# Patient Record
Sex: Male | Born: 1955 | Race: Black or African American | Hispanic: No | Marital: Single | Smoking: Former smoker
Health system: Southern US, Community
[De-identification: ages and names within clinical notes are randomized; demographics above are authoritative.]

## PROBLEM LIST (undated history)

## (undated) DIAGNOSIS — E7800 Pure hypercholesterolemia, unspecified: Secondary | ICD-10-CM

## (undated) DIAGNOSIS — R269 Unspecified abnormalities of gait and mobility: Secondary | ICD-10-CM

## (undated) DIAGNOSIS — I10 Essential (primary) hypertension: Secondary | ICD-10-CM

---

## 2020-09-04 ENCOUNTER — Other Ambulatory Visit: Payer: Self-pay

## 2020-09-04 ENCOUNTER — Emergency Department (HOSPITAL_COMMUNITY)
Admission: EM | Admit: 2020-09-04 | Discharge: 2020-09-05 | Disposition: A | Discharge status: Skilled Nursing Facility | Payer: Medicaid Other | Attending: Emergency Medicine | Admitting: Emergency Medicine

## 2020-09-04 DIAGNOSIS — R7981 Abnormal blood-gas level: Secondary | ICD-10-CM | POA: Insufficient documentation

## 2020-09-04 DIAGNOSIS — Z87891 Personal history of nicotine dependence: Secondary | ICD-10-CM | POA: Diagnosis not present

## 2020-09-04 DIAGNOSIS — I10 Essential (primary) hypertension: Secondary | ICD-10-CM | POA: Insufficient documentation

## 2020-09-04 DIAGNOSIS — R0602 Shortness of breath: Secondary | ICD-10-CM | POA: Diagnosis present

## 2020-09-04 HISTORY — DX: Essential (primary) hypertension: I10

## 2020-09-04 HISTORY — DX: Pure hypercholesterolemia, unspecified: E78.00

## 2020-09-04 HISTORY — DX: Unspecified abnormalities of gait and mobility: R26.9

## 2020-09-04 NOTE — ED Triage Notes (Signed)
Pt arrives by EMS from [facility].  Pt had low spo2 for nursing home staff around 60% on RA and they placed him on 4L [place], they reported to ems that pt was not in any distress with this and they and called ems an hour later.  EMS found pt in no distress spo2 in high 90's on RA.  Pt denies any sob or distress and is speaking in full sentences.  Pt has cool fingers on arrival to ED and a little tremor making it difficult for the spo2 to read on his finger, once spo2 monitor was placed on his earlobe and a good waveform is seen and pt spo2 100% on RA

## 2020-09-05 ENCOUNTER — Encounter (HOSPITAL_COMMUNITY): Payer: Self-pay | Admitting: *Deleted

## 2020-09-05 ENCOUNTER — Other Ambulatory Visit: Payer: Self-pay

## 2020-09-05 NOTE — ED Notes (Signed)
pt appears in no distress and reports no distress today PTA.  spo2 reading on fingers was not possible with our probe.  infant probe placed on earlobe showing 100% on RA.  EMS reports that when they arrived at SNF they could not get their monitor to read but used another probe en route which showed high 90's to 100% on RA.

## 2020-09-05 NOTE — Discharge Instructions (Addendum)
The patient's oxygen saturation have remained >95% on room air. His fingers are cold and will limit his reading. His ear lobes provided a good reading.

## 2020-09-05 NOTE — ED Notes (Signed)
Call for transport back to SNF

## 2020-09-05 NOTE — ED Provider Notes (Signed)
Dean Phillips Provider Note  CSN: [id number] Arrival date & time: 09/04/20 2349  Chief Complaint(s) Shortness of Breath  HPI Dean Phillips is a 64 y.o. male with a past medical history listed below including prior CVA living in a skilled nursing facility who presents to the emergency department with low oxygen saturation level readings. Patient reports that he was getting routine vitals obtained when they noted low saturations. He denied feeling any shortness of breath. He denies recent cough or congestion. No associated chest pain. The staff at the facility placed the patient on 4 L nasal cannula for about an hour. Afterwards they called EMS. When EMS reportedly arrived patient was satting in the high 90s on room air and without any respiratory complaints. He remained hemodynamically stable in route. Currently he has no physical complaints other than being hungry.  HPI  Past Medical History Past Medical History:  Diagnosis Date  . Gait abnormality   . High cholesterol   . Hypertension    There are no problems to display for this patient.  Home Medication(s) Prior to Admission medications   Not on File                                                                                                                                    Past Surgical History History reviewed. No pertinent surgical history. Family History No family history on file.  Social History Social History   Tobacco Use  . Smoking status: Former Research scientist (life sciences)  . Smokeless tobacco: Never Used  Substance Use Topics  . Alcohol use: Not Currently  . Drug use: Not Currently   Allergies Patient has no known allergies.  Review of Systems Review of Systems All other systems are reviewed and are negative for acute change except as noted in the HPI  Physical Exam Vital Signs  I have reviewed the triage vital signs BP 159/99   Pulse 98  Temp 98.4 F (36.9 C) (Oral)   Resp (!) 21    Wt 71.7 kg   SpO2 99%   Physical Exam Vitals reviewed.  Constitutional:      General: He is not in acute distress.    Appearance: He is well-developed. He is not diaphoretic.  HENT:     Head: Normocephalic and atraumatic.     Nose: Nose normal.  Eyes:     General: No scleral icterus.       Right eye: No discharge.        Left eye: No discharge.     Conjunctiva/sclera: Conjunctivae normal.     Pupils: Pupils are equal, round, and reactive to light.  Cardiovascular:     Rate and Rhythm: Normal rate and regular rhythm.     Heart sounds: No murmur heard.  No friction rub. No gallop.   Pulmonary:     Effort: Pulmonary effort is normal. No respiratory distress.     Breath  sounds: Normal breath sounds. No stridor. No rales.  Abdominal:     General: There is no distension.     Palpations: Abdomen is soft.     Tenderness: There is no abdominal tenderness.  Musculoskeletal:        General: No tenderness.     Cervical back: Normal range of motion and neck supple.  Skin:    General: Skin is warm and dry.     Findings: No erythema or rash.  Neurological:     Mental Status: He is alert and oriented to person, place, and time.     ED Results and Treatments Labs (all labs ordered are listed, but only abnormal results are displayed) Labs Reviewed - No data to display                                                                                                                       EKG  EKG Interpretation  Date/Time:    Ventricular Rate:    PR Interval:    QRS Duration:   QT Interval:    QTC Calculation:   R Axis:     Text Interpretation:        Radiology No results found.  Pertinent labs & imaging results that were available during my care of the patient were reviewed by me and considered in my medical decision making (see chart for details).  Medications Ordered in ED Medications - No data to display                                                                                                                                   Procedures Procedures  (including critical care time)  Medical Decision Making / ED Course I have reviewed the nursing notes for this encounter and the patient's prior records (if available in EHR or on provided paperwork).   Dean Phillips was evaluated in Emergency Department on 09/05/2020 for the symptoms described in the history of present illness. He was evaluated in the context of the global COVID-19 pandemic, which necessitated consideration that the patient might be at risk for infection with the SARS-CoV-2 virus that causes COVID-19. Institutional protocols and algorithms that pertain to the evaluation of patients at risk for COVID-19 are in a state of rapid change based on information released by regulatory bodies including the CDC and federal and state organizations. These policies and algorithms were followed during the patient's  care in the ED.  Patient is in no respiratory distress satting greater than 95% on room air. Lungs are clear to auscultation bilaterally. Discussed obtaining labs and imaging with the patient which he graciously declined. He did request being provided with food which we gladly provided. Patient was monitored for several hours without any deterioration. Felt to be safe to be transferred back to the facility.      Final Clinical Impression(s) / ED Diagnoses Final diagnoses:  Low O2 saturation   The patient appears reasonably screened and/or stabilized for discharge and I doubt any other medical condition or other [facility] requiring further screening, evaluation, or treatment in the ED at this time prior to discharge. Safe for discharge with strict return precautions.  Disposition: Discharge  Condition: Good  I have discussed the results, Dx and Tx plan with the patient/family who expressed understanding and agree(s) with the plan. Discharge instructions discussed at length. The patient/family was  given strict return precautions who verbalized understanding of the instructions. No further questions at time of discharge.    ED Discharge Orders    None        Follow Up: Wenda Low, MD [address] [place] [address] [phone]  Schedule an appointment as soon as possible for a visit  As needed      This chart was dictated using voice recognition software.  Despite best efforts to proofread,  errors can occur which can change the documentation meaning.   Fatima Blank, MD 09/05/20 0300

## 2020-12-21 ENCOUNTER — Encounter (HOSPITAL_COMMUNITY): Payer: Self-pay | Admitting: Internal Medicine

## 2020-12-21 ENCOUNTER — Emergency Department (HOSPITAL_COMMUNITY): Payer: Medicaid Other

## 2020-12-21 ENCOUNTER — Inpatient Hospital Stay (HOSPITAL_COMMUNITY)
Admission: EM | Admit: 2020-12-21 | Discharge: 2020-12-26 | DRG: 177 | Disposition: A | Discharge status: Hospice | Payer: Medicaid Other | Attending: Internal Medicine | Admitting: Internal Medicine

## 2020-12-21 ENCOUNTER — Inpatient Hospital Stay (HOSPITAL_COMMUNITY): Payer: Medicaid Other

## 2020-12-21 DIAGNOSIS — Z21 Asymptomatic human immunodeficiency virus [HIV] infection status: Secondary | ICD-10-CM | POA: Diagnosis present

## 2020-12-21 DIAGNOSIS — I120 Hypertensive chronic kidney disease with stage 5 chronic kidney disease or end stage renal disease: Secondary | ICD-10-CM | POA: Diagnosis present

## 2020-12-21 DIAGNOSIS — E872 Acidosis: Secondary | ICD-10-CM | POA: Diagnosis present

## 2020-12-21 DIAGNOSIS — E8770 Fluid overload, unspecified: Secondary | ICD-10-CM | POA: Diagnosis present

## 2020-12-21 DIAGNOSIS — Z8673 Personal history of transient ischemic attack (TIA), and cerebral infarction without residual deficits: Secondary | ICD-10-CM

## 2020-12-21 DIAGNOSIS — Z515 Encounter for palliative care: Secondary | ICD-10-CM

## 2020-12-21 DIAGNOSIS — G9340 Encephalopathy, unspecified: Secondary | ICD-10-CM

## 2020-12-21 DIAGNOSIS — E878 Other disorders of electrolyte and fluid balance, not elsewhere classified: Secondary | ICD-10-CM | POA: Diagnosis present

## 2020-12-21 DIAGNOSIS — Z820 Family history of epilepsy and other diseases of the nervous system: Secondary | ICD-10-CM

## 2020-12-21 DIAGNOSIS — E785 Hyperlipidemia, unspecified: Secondary | ICD-10-CM | POA: Diagnosis present

## 2020-12-21 DIAGNOSIS — J1282 Pneumonia due to coronavirus disease 2019: Secondary | ICD-10-CM | POA: Diagnosis present

## 2020-12-21 DIAGNOSIS — N179 Acute kidney failure, unspecified: Secondary | ICD-10-CM

## 2020-12-21 DIAGNOSIS — E875 Hyperkalemia: Secondary | ICD-10-CM | POA: Diagnosis present

## 2020-12-21 DIAGNOSIS — E860 Dehydration: Secondary | ICD-10-CM | POA: Diagnosis present

## 2020-12-21 DIAGNOSIS — Z8249 Family history of ischemic heart disease and other diseases of the circulatory system: Secondary | ICD-10-CM

## 2020-12-21 DIAGNOSIS — Z66 Do not resuscitate: Secondary | ICD-10-CM | POA: Diagnosis not present

## 2020-12-21 DIAGNOSIS — E870 Hyperosmolality and hypernatremia: Secondary | ICD-10-CM | POA: Diagnosis present

## 2020-12-21 DIAGNOSIS — G9341 Metabolic encephalopathy: Secondary | ICD-10-CM | POA: Diagnosis present

## 2020-12-21 DIAGNOSIS — U071 COVID-19: Principal | ICD-10-CM | POA: Diagnosis present

## 2020-12-21 DIAGNOSIS — J9601 Acute respiratory failure with hypoxia: Secondary | ICD-10-CM | POA: Diagnosis present

## 2020-12-21 DIAGNOSIS — N186 End stage renal disease: Secondary | ICD-10-CM | POA: Diagnosis present

## 2020-12-21 DIAGNOSIS — F0390 Unspecified dementia without behavioral disturbance: Secondary | ICD-10-CM | POA: Diagnosis present

## 2020-12-21 DIAGNOSIS — N170 Acute kidney failure with tubular necrosis: Secondary | ICD-10-CM

## 2020-12-21 DIAGNOSIS — R0902 Hypoxemia: Secondary | ICD-10-CM | POA: Diagnosis not present

## 2020-12-21 DIAGNOSIS — Z87891 Personal history of nicotine dependence: Secondary | ICD-10-CM | POA: Diagnosis not present

## 2020-12-21 DIAGNOSIS — E8729 Other acidosis: Secondary | ICD-10-CM

## 2020-12-21 LAB — CBC WITH DIFFERENTIAL/PLATELET
Abs Immature Granulocytes: 0.07 10*3/uL (ref 0.00–0.07)
Basophils Absolute: 0 10*3/uL (ref 0.0–0.1)
Basophils Relative: 0 %
Eosinophils Absolute: 0 10*3/uL (ref 0.0–0.5)
Eosinophils Relative: 0 %
HCT: 45.6 % (ref 39.0–52.0)
Hemoglobin: 14.7 g/dL (ref 13.0–17.0)
Immature Granulocytes: 1 %
Lymphocytes Relative: 8 %
Lymphs Abs: 0.9 10*3/uL (ref 0.7–4.0)
MCH: 30 pg (ref 26.0–34.0)
MCHC: 32.2 g/dL (ref 30.0–36.0)
MCV: 93.1 fL (ref 80.0–100.0)
Monocytes Absolute: 0.7 10*3/uL (ref 0.1–1.0)
Monocytes Relative: 7 %
Neutro Abs: 8.6 10*3/uL — ABNORMAL HIGH (ref 1.7–7.7)
Neutrophils Relative %: 84 %
Platelets: 179 10*3/uL (ref 150–400)
RBC: 4.9 MIL/uL (ref 4.22–5.81)
RDW: 16.1 % — ABNORMAL HIGH (ref 11.5–15.5)
WBC: 10.2 10*3/uL (ref 4.0–10.5)
nRBC: 0 % (ref 0.0–0.2)

## 2020-12-21 LAB — COMPREHENSIVE METABOLIC PANEL
ALT: 31 U/L (ref 0–44)
AST: 44 U/L — ABNORMAL HIGH (ref 15–41)
Albumin: 2.7 g/dL — ABNORMAL LOW (ref 3.5–5.0)
Alkaline Phosphatase: 66 U/L (ref 38–126)
Anion gap: 17 — ABNORMAL HIGH (ref 5–15)
BUN: 133 mg/dL — ABNORMAL HIGH (ref 8–23)
CO2: 19 mmol/L — ABNORMAL LOW (ref 22–32)
Calcium: 8.4 mg/dL — ABNORMAL LOW (ref 8.9–10.3)
Chloride: 116 mmol/L — ABNORMAL HIGH (ref 98–111)
Creatinine, Ser: 9.59 mg/dL — ABNORMAL HIGH (ref 0.61–1.24)
GFR, Estimated: 6 mL/min — ABNORMAL LOW (ref >60)
Glucose, Bld: 132 mg/dL — ABNORMAL HIGH (ref 70–99)
Potassium: 5.6 mmol/L — ABNORMAL HIGH (ref 3.5–5.1)
Sodium: 152 mmol/L — ABNORMAL HIGH (ref 135–145)
Total Bilirubin: 0.9 mg/dL (ref 0.3–1.2)
Total Protein: 7.9 g/dL (ref 6.5–8.1)

## 2020-12-21 LAB — AMMONIA: Ammonia: 14 umol/L (ref 9–35)

## 2020-12-21 LAB — LACTIC ACID, PLASMA
Lactic Acid, Venous: 1.9 mmol/L (ref 0.5–1.9)
Lactic Acid, Venous: 1.5 mmol/L (ref 0.5–1.9)

## 2020-12-21 LAB — CBG MONITORING, ED: Glucose-Capillary: 109 mg/dL — ABNORMAL HIGH (ref 70–99)

## 2020-12-21 LAB — PROTIME-INR
INR: 1.1 (ref 0.8–1.2)
Prothrombin Time: 13.3 seconds (ref 11.4–15.2)

## 2020-12-21 LAB — APTT: aPTT: 40 seconds — ABNORMAL HIGH (ref 24–36)

## 2020-12-21 LAB — SARS CORONAVIRUS 2 BY RT PCR (HOSPITAL ORDER, PERFORMED IN ~~LOC~~ HOSPITAL LAB): SARS Coronavirus 2: POSITIVE — AB

## 2020-12-21 MED ORDER — ASCORBIC ACID 500 MG PO TABS: 500.0000 mg | ORAL_TABLET | Freq: Every day | ORAL | Status: DC

## 2020-12-21 MED ORDER — SODIUM CHLORIDE 0.9 % IV BOLUS
1000.0000 mL | Freq: Once | INTRAVENOUS | Status: AC
  Administered 2020-12-21: 1000 mL via INTRAVENOUS

## 2020-12-21 MED ORDER — DEXAMETHASONE SODIUM PHOSPHATE 10 MG/ML IJ SOLN: 6.0000 mg | INTRAMUSCULAR | Status: DC

## 2020-12-21 MED ORDER — ACETAMINOPHEN 325 MG PO TABS: 650.0000 mg | ORAL_TABLET | Freq: Four times a day (QID) | ORAL | Status: DC | PRN

## 2020-12-21 MED ORDER — HYDROCOD POLST-CPM POLST ER 10-8 MG/5ML PO SUER: 5.0000 mL | Freq: Two times a day (BID) | ORAL | Status: DC | PRN

## 2020-12-21 MED ORDER — SODIUM CHLORIDE 0.9 % IV SOLN
100.0000 mg | Freq: Every day | INTRAVENOUS | Status: DC
  Administered 2020-12-22: 100 mg via INTRAVENOUS
  Filled 2020-12-21: qty 20

## 2020-12-21 MED ORDER — ACETAMINOPHEN 650 MG RE SUPP
650.0000 mg | Freq: Once | RECTAL | Status: AC
  Administered 2020-12-21: 650 mg via RECTAL
  Filled 2020-12-21: qty 1

## 2020-12-21 MED ORDER — HEPARIN SODIUM (PORCINE) 5000 UNIT/ML IJ SOLN
5000.0000 [IU] | Freq: Three times a day (TID) | INTRAMUSCULAR | Status: DC
  Administered 2020-12-22 (×2): 5000 [IU] via SUBCUTANEOUS
  Filled 2020-12-21 (×2): qty 1

## 2020-12-21 MED ORDER — DEXAMETHASONE SODIUM PHOSPHATE 10 MG/ML IJ SOLN
10.0000 mg | Freq: Once | INTRAMUSCULAR | Status: AC
  Administered 2020-12-21: 10 mg via INTRAVENOUS
  Filled 2020-12-21: qty 1

## 2020-12-21 MED ORDER — GUAIFENESIN-DM 100-10 MG/5ML PO SYRP: 10.0000 mL | ORAL_SOLUTION | ORAL | Status: DC | PRN

## 2020-12-21 MED ORDER — SODIUM CHLORIDE 0.9 % IV SOLN
200.0000 mg | Freq: Once | INTRAVENOUS | Status: AC
  Administered 2020-12-21: 200 mg via INTRAVENOUS
  Filled 2020-12-21: qty 40

## 2020-12-21 MED ORDER — SODIUM CHLORIDE 0.9 % IV SOLN: INTRAVENOUS | Status: DC

## 2020-12-21 MED ORDER — IPRATROPIUM-ALBUTEROL 20-100 MCG/ACT IN AERS
1.0000 | INHALATION_SPRAY | Freq: Four times a day (QID) | RESPIRATORY_TRACT | Status: DC | PRN
  Filled 2020-12-21: qty 4

## 2020-12-21 MED ORDER — SODIUM CHLORIDE 0.9% FLUSH
3.0000 mL | Freq: Two times a day (BID) | INTRAVENOUS | Status: DC
  Administered 2020-12-22 – 2020-12-26 (×9): 3 mL via INTRAVENOUS

## 2020-12-21 MED ORDER — ZINC SULFATE 220 (50 ZN) MG PO CAPS: 220.0000 mg | ORAL_CAPSULE | Freq: Every day | ORAL | Status: DC

## 2020-12-21 NOTE — ED Notes (Signed)
PT to US

## 2020-12-21 NOTE — ED Triage Notes (Signed)
Pt arrived to ED via EMS from nursing facility. Per EMS were called for hypoxia. SNF told EMS pt was found with spo2 in the 60's. Upon EMS arrived pt on 4L of O2 and spo2 at 90%. Pt with noted cough and febrile. Pt was previously dx with covid, unknown date.

## 2020-12-21 NOTE — Consult Note (Addendum)
Nephrology Consult   Requesting provider: Milton Ferguson Service requesting consult: ER Reason for consult: AKI on CKD 3   Assessment/Recommendations: Dean Phillips is a/an 65 y.o. male with a past medical history dementia, htn, hld, ckd who present w/ multifocal pneumonia, acute hypoxic respiratory failure, and severe AKI  Severe AKI: w/ electrolyte disturbance and likely uremia.  Renal ultrasound without obstruction.  Differential is broad at this time but most likely ATN in the setting of Covid infection.  Dehydration is possible but creatinine is not significantly improved today after some hydration.  Because of his baseline dementia his long-term candidacy for dialysis is poor.  However, the patient has not improved very quickly and may require dialysis in the interim.  We will have ongoing discussions with daughter as when we talked last night she was not sure if the patient would want dialysis -Suggest on going IV hydration as tolerated with hypoxia -Switch to D5 water as below -Follow-up urinalysis and monitor urine output -Management of hyperkalemia as below -Continue GOC conversations regarding dialysis -Maintain n.p.o. for now for possible catheter  Multifocal Pneumonia/Acute Hypoxic Respiratory Failure: likely ongoing COVID possible secondary bacterial. Defer mgmt to primary  Hypernatremia: likely 2/2 poor PO and dehydration -Switch fluids to D5 water at 125 cc/h -Repeat BMP at noon  Hyperkalemia: 2/2 AKI.  Worsening today unable to tolerate p.o. due to mental status so can't use lokelma.  Treat with insulin and dextrose.  Considering dialysis as above.  AGMA: small AG at 17 2/2 AKI. CTM  HTN: reported history. Unclear meds. Would hold meds for right now especially an ARB/ace or diuretics  Dementia w/ AMS: likely toxic metabolic and uremic AMS currently. Treating issues as above.   Recommendations conveyed to primary service.    [facility] 12/21/2020 6:48 PM   _____________________________________________________________________________________ CC: AKI, AMS  History of Present Illness: Dean Phillips is a/an 65 y.o. male with a past medical history of CKD, dementia, HTN, HLD who presents with hypoxia.  Patient was altered so I obtained history per chart review and his Sister-in-law Leda Quail) who is listed as emergency contact.  Patient has chronic dementia and lives at a long term care facility.  Leda Quail says he has had several hospitalization over the last few years that have led to worsening debility. She does know about him having chronic kidney disease.  Baseline at his baseline he is conversational and able to answer questions, walk, feed himself but his memory is poor.  He was found to be covid+ 2 weeks ago and was isolated at his facility. Minimal symptoms at that time and completed his isolation. For the last 24-48 hours he was more fatigued and was noted to be hypoxic. He has had ongoing cough and fever. No diarrhea per staff. Patient does sound to be more confused than his baseline. Unclear what his urine output or po intake has been like. At this time I do not know what his home medications include.  Patient was requiring 4L [place] on arrival now on 3L. CXR demonstrated multifocal pneumonia. Lab demonstrated K of 5.6, Na of 152, and Crt of 9.6. BL Crt thought to be around 1.5. Lactic acid normal. No hypotension   Medications:  Current Facility-Administered Medications  Medication Dose Route Frequency Provider Last Rate Last Admin  . dexamethasone (DECADRON) injection 10 mg  10 mg Intravenous Once Horton, Kristie M, DO      . sodium chloride 0.9 % bolus 1,000 mL  1,000 mL  Intravenous Once Milton Ferguson, MD       No current outpatient medications on file.     ALLERGIES Patient has no known allergies.  MEDICAL HISTORY Past Medical History:  Diagnosis Date  . Gait abnormality   . High cholesterol   .  Hypertension      SOCIAL HISTORY Social History   Socioeconomic History  . Marital status: Single    Spouse name: Not on file  . Number of children: Not on file  . Years of education: Not on file  . Highest education level: Not on file  Occupational History  . Not on file  Tobacco Use  . Smoking status: Former Research scientist (life sciences)  . Smokeless tobacco: Never Used  Substance and Sexual Activity  . Alcohol use: Not Currently  . Drug use: Not Currently  . Sexual activity: Not Currently  Other Topics Concern  . Not on file  Social History Narrative  . Not on file   Social Determinants of Health   Financial Resource Strain: Not on file  Food Insecurity: Not on file  Transportation Needs: Not on file  Physical Activity: Not on file  Stress: Not on file  Social Connections: Not on file  Intimate Partner Violence: Not on file     FAMILY HISTORY Unable to obtain due to AMS   Review of Systems: Unable to obtain due to AMS  Physical Exam: Vitals:   12/21/20 1830 12/21/20 1845  BP:  116/77  Pulse:  100  Resp: (!) 24 (!) 24  Temp:  99 F (37.2 C)  SpO2:  100%   Total I/O In: 500 [I.V.:500] Out: -   Intake/Output Summary (Last 24 hours) at 12/21/2020 1848 Last data filed at 12/21/2020 1315 Gross per 24 hour  Intake 500 ml  Output --  Net 500 ml   General: ill appearing, lying in bed HEENT: anicteric sclera, dry mucus membranes CV: tachycardia, no obvius murmurs Lungs: coarse bilateral breath sounds, no iwob Abd: soft, non-tender, non-distended Skin: no visible lesions or rashes MSK; no joint swelling or deformity Neuro: awake, tired, unable to answers questions Psych: mood seems normal, normal affect  Test Results Reviewed Lab Results  Component Value Date   NA 152 (H) 12/21/2020   K 5.6 (H) 12/21/2020   CL 116 (H) 12/21/2020   CO2 19 (L) 12/21/2020   BUN 133 (H) 12/21/2020   CREATININE 9.59 (H) 12/21/2020   CALCIUM 8.4 (L) 12/21/2020   ALBUMIN 2.7 (L)  12/21/2020     I have reviewed all relevant outside healthcare records related to the patient's current hospitalization

## 2020-12-21 NOTE — ED Provider Notes (Signed)
Patient with mild hypoxia and Covid infection.  Patient also with severe dehydration and renal failure.  He will be admitted to medicine with nephrology consult and will be hydrated   Milton Ferguson, MD 12/21/20 [id number]

## 2020-12-21 NOTE — ED Provider Notes (Signed)
[facility] EMERGENCY DEPARTMENT Provider Note   CSN: [id number] Arrival date & time: 12/21/20  1305     History Chief Complaint  Patient presents with  . Respiratory Distress    Dean Phillips is a 65 y.o. male.  HPI   65 year old male with past medical history of HTN, HLD presents the emergency department concern for hypoxia.  Patient was reportedly Covid positive about 2 weeks ago.  Completed his isolation.  Over the last day became weak, was noted to be hypoxic this morning by nursing staff.  He was reportedly hypoxic to the 60s on room air.  Placed on 4 L of nasal cannula and improved into the 90s.  They report cough and fever also.  No GI symptoms.  Patient is weak and alert on arrival but noncontributory towards history at this time.  Past Medical History:  Diagnosis Date  . Gait abnormality   . High cholesterol   . Hypertension     There are no problems to display for this patient.   No past surgical history on file.     No family history on file.  Social History   Tobacco Use  . Smoking status: Former Research scientist (life sciences)  . Smokeless tobacco: Never Used  Substance Use Topics  . Alcohol use: Not Currently  . Drug use: Not Currently    Home Medications Prior to Admission medications   Not on File    Allergies    Patient has no known allergies.  Review of Systems   Review of Systems  Unable to perform ROS: Mental status change    Physical Exam Updated Vital Signs BP 119/83   Pulse (!) 107   Temp (!) 101 F (38.3 C) (Rectal)   Resp (!) 24   SpO2 100%   Physical Exam Vitals and nursing note reviewed.  Constitutional:      Appearance: Normal appearance. He is ill-appearing.  HENT:     Head: Normocephalic.     Mouth/Throat:     Mouth: Mucous membranes are moist.  Eyes:     Pupils: Pupils are equal, round, and reactive to light.  Cardiovascular:     Rate and Rhythm: Normal rate.  Pulmonary:     Effort: Pulmonary effort is normal.  No respiratory distress.     Breath sounds: Rales present.  Abdominal:     Palpations: Abdomen is soft.     Tenderness: There is no abdominal tenderness.  Musculoskeletal:        General: No swelling.  Skin:    General: Skin is warm.  Neurological:     Mental Status: He is alert.     Comments: Mumbling, opens his eyes to name, follows commands  Psychiatric:        Mood and Affect: Mood normal.     ED Results / Procedures / Treatments   Labs (all labs ordered are listed, but only abnormal results are displayed) Labs Reviewed  CBC WITH DIFFERENTIAL/PLATELET - Abnormal; Notable for the following components:      Result Value   RDW 16.1 (*)    Neutro Abs 8.6 (*)    All other components within normal limits  APTT - Abnormal; Notable for the following components:   aPTT 40 (*)    All other components within normal limits  CBG MONITORING, ED - Abnormal; Notable for the following components:   Glucose-Capillary 109 (*)    All other components within normal limits  CULTURE, BLOOD (SINGLE)  URINE CULTURE  SARS  CORONAVIRUS 2 BY RT PCR (HOSPITAL ORDER, [place] [facility])  LACTIC ACID, PLASMA  PROTIME-INR  LACTIC ACID, PLASMA  URINALYSIS, ROUTINE W REFLEX MICROSCOPIC  COMPREHENSIVE METABOLIC PANEL  AMMONIA  I-STAT VENOUS BLOOD GAS, ED    EKG EKG Interpretation  Date/Time:  Thursday December 21 2020 13:14:56 EST Ventricular Rate:  108 PR Interval:    QRS Duration: 84 QT Interval:  331 QTC Calculation: 444 R Axis:   76 Text Interpretation: Sinus tachycardia Consider left ventricular hypertrophy Sinus tachycardia, normal intervals Confirmed by Lavenia Atlas [phone]) on 12/21/2020 1:23:05 PM   Radiology DG Chest Port 1 View  Result Date: 12/21/2020 CLINICAL DATA:  Sepsis EXAM: PORTABLE CHEST 1 VIEW COMPARISON:  None. FINDINGS: There is ill-defined airspace opacity in the right mid lung and both lung base regions. There is atelectatic change in the right  base. No consolidation. Heart size and pulmonary vascularity are within normal limits. No adenopathy. There is aortic atherosclerosis. No bone lesions. IMPRESSION: Areas of ill-defined airspace opacity bilaterally concerning for atypical organism pneumonia. Check of COVID-19 status advised. Right base atelectasis. No consolidation or edema. Heart size normal.  No adenopathy appreciable. Aortic Atherosclerosis (ICD10-I70.0). Electronically Signed   By: Lowella Grip III M.D.   On: 12/21/2020 14:49    Procedures .Critical Care Performed by: Lorelle Gibbs, DO Authorized by: Lorelle Gibbs, DO   Critical care provider statement:    Critical care time (minutes):  45   Critical care was time spent personally by me on the following activities:  Discussions with consultants, evaluation of patient's response to treatment, examination of patient, ordering and performing treatments and interventions, ordering and review of laboratory studies, ordering and review of radiographic studies, pulse oximetry, re-evaluation of patient's condition, obtaining history from patient or surrogate and review of old charts   I assumed direction of critical care for this patient from another provider in my specialty: yes       Medications Ordered in ED Medications  dexamethasone (DECADRON) injection 10 mg (has no administration in time range)  acetaminophen (TYLENOL) suppository 650 mg (650 mg Rectal Given 12/21/20 1523)    ED Course  I have reviewed the triage vital signs and the nursing notes.  Pertinent labs & imaging results that were available during my care of the patient were reviewed by me and considered in my medical decision making (see chart for details).    MDM Rules/Calculators/A&P                          65 year old male presents the emergency department reported hypoxia, on 4 L nasal cannula he is an appropriate oxygenation.  Patient opens his eyes to name and follows commands, sometimes  shakes his head yes or no but otherwise mumbles and I am not able to understand him.  I spoke with the nurse from the facility and they states that he is a quiet man, possible some underlying dementia but otherwise is usually more conversational than what I am witnessing.  Patient shakes his head no when I ask for any acute pain.  He is tachycardic, febrile on arrival. Will evaluate for infection, sepsis, ongoing Covid.  Blood work is clotted. CBC is reassuring, lactic is normal, ammonia is normal, we are pending chemistry. Chest x-ray reflects Covid findings. Patient will require admission regardless. Patient signed out pending chemistry.  Final Clinical Impression(s) / ED Diagnoses Final diagnoses:  None    Rx /  DC Orders ED Discharge Orders    None       Lorelle Gibbs, DO 12/21/20 1735

## 2020-12-21 NOTE — H&P (Signed)
History and Physical   Dean Phillips [id number] DOB: 29-May-1956 DOA: 12/21/2020  PCP: Wenda Low, MD   Patient coming from: Nursing home  Chief Complaint: Hypoxia  HPI: Dean Phillips is a 65 y.o. male with medical history significant of stroke, hyperlipidemia, hypertension who presents after being found to be hypoxic at nursing facility.  Some history obtained with assistance of chart review due to patient's inability to participate in HPI.  Patient reportedly tested positive for Covid 2 weeks ago and had finished his isolation.  He did have 1 day of weakness and nursing staff had evaluated him in few found to be hypoxic to the 60s which improved to the 90s on 4 L.  Also reporting continued fever and cough.  Per reports from nursing staff his baseline is he is quiet but can carry on a conversation.  And he has been alert but not conversive in the ED.  Per his sister he has some degree of undiagnosed dementia but is not been formally evaluated.  He is able to have conversation, walk, feed himself.  But he has very poor memory.  ED Course: Vital signs significant for requiring 4 L to maintain saturations.  Fever to 101.  Tachypnea in the 20s, tachycardia in 100s.  Lab work-up showed BMP with sodium 152, potassium 5.6, chloride 116, bicarb 19 with gap 17, BUN 135, creatinine 9.59, glucose 132.  LFTs showed AST 49, calcium 8.4 which corrects considering albumin of 2.7.  CBC within normal limits.  PT INR within normal limits.  PTT 40.  Lactic acid negative x2.  Respiratory panel for flu and Covid pending, but this is unknown previous positive.  Urinalysis, urine culture blood culture pending.  Ammonia normal, VBG pending.  Chest x-ray showed areas of ill-defined airspace opacities bilaterally.  Nephrology was consulted in the ED to assist with new onset AKI.  Patient started on steroids in ED.  Review of Systems: Unable to evaluate due to patient's mental status.   Past Medical History:   Diagnosis Date  . Gait abnormality   . High cholesterol   . Hypertension    History reviewed. No pertinent surgical history.  Social History  reports that he has quit smoking. He has never used smokeless tobacco. He reports previous alcohol use. He reports previous drug use.  No Known Allergies  Family History  Problem Relation Age of Onset  . Hypertension Mother   . Hypertension Father   . Parkinson's disease Brother     Unable to obtain due to patient's mental status  Prior to Admission medications   Not on File  Aspirin Amlodipine 10 mg daily Atorvastatin 40 mg daily Mirtazapine nightly for appetite  Physical Exam: Vitals:   12/21/20 1730 12/21/20 1800 12/21/20 1830 12/21/20 1845  BP:  116/77  116/77  Pulse:    100  Resp: (!) 25 18 (!) 24 (!) 24  Temp:    99 F (37.2 C)  TempSrc:    Oral  SpO2:  96%  100%   Physical Exam Constitutional:      General: He is not in acute distress.    Comments: Chronically ill-appearing elderly male  HENT:     Head: Normocephalic and atraumatic.     Mouth/Throat:     Mouth: Mucous membranes are moist.     Pharynx: Oropharynx is clear.  Eyes:     Extraocular Movements: Extraocular movements intact.     Pupils: Pupils are equal, round, and reactive to light.  Cardiovascular:  Rate and Rhythm: Normal rate and regular rhythm.     Pulses: Normal pulses.     Heart sounds: Normal heart sounds.  Pulmonary:     Effort: Pulmonary effort is normal. No respiratory distress.     Breath sounds: Rales (Trace) present.  Abdominal:     General: Bowel sounds are normal. There is no distension.     Palpations: Abdomen is soft.     Tenderness: There is no abdominal tenderness.  Musculoskeletal:        General: No swelling or deformity.  Skin:    General: Skin is warm and dry.  Neurological:     General: No focal deficit present.     Comments: Patient largely nonverbal, will say yes when I asked him to say yes, will nod his head yes  when asked him to nod his head yes.  Otherwise does not follow commands and does not converse.    Labs on Admission: I have personally reviewed following labs and imaging studies  CBC: Recent Labs  Lab 12/21/20 1335  WBC 10.2  NEUTROABS 8.6*  HGB 14.7  HCT 45.6  MCV 93.1  PLT 0000000    Basic Metabolic Panel: Recent Labs  Lab 12/21/20 1616  NA 152*  K 5.6*  CL 116*  CO2 19*  GLUCOSE 132*  BUN 133*  CREATININE 9.59*  CALCIUM 8.4*    GFR: CrCl cannot be calculated (Unknown ideal weight.).  Liver Function Tests: Recent Labs  Lab 12/21/20 1616  AST 44*  ALT 31  ALKPHOS 66  BILITOT 0.9  PROT 7.9  ALBUMIN 2.7*    Urine analysis: No results found for: COLORURINE, APPEARANCEUR, LABSPEC, PHURINE, GLUCOSEU, HGBUR, BILIRUBINUR, KETONESUR, PROTEINUR, UROBILINOGEN, NITRITE, LEUKOCYTESUR  Radiological Exams on Admission: US RENAL  Result Date: 12/21/2020 CLINICAL DATA:  65 year old male with acute renal insufficiency. EXAM: RENAL / URINARY TRACT ULTRASOUND COMPLETE COMPARISON:  None. FINDINGS: Right Kidney: Renal measurements: 9.5 x 3.4 x 4.5 cm = volume: 78 mL. There is increased renal parenchyma echogenicity. No hydronephrosis or shadowing stone. Left Kidney: Renal measurements: 8.1 x 4.0 x 3.1 cm = volume: 52 mL. There is diffuse increased renal parenchymal echogenicity. No hydronephrosis or shadowing stone. Bladder: Appears normal for degree of bladder distention. Other: None. IMPRESSION: Echogenic kidneys likely related to medical renal disease. No hydronephrosis or shadowing stone. Electronically Signed   By: Anner Crete M.D.   On: 12/21/2020 20:25   DG Chest Port 1 View  Result Date: 12/21/2020 CLINICAL DATA:  Sepsis EXAM: PORTABLE CHEST 1 VIEW COMPARISON:  None. FINDINGS: There is ill-defined airspace opacity in the right mid lung and both lung base regions. There is atelectatic change in the right base. No consolidation. Heart size and pulmonary vascularity are  within normal limits. No adenopathy. There is aortic atherosclerosis. No bone lesions. IMPRESSION: Areas of ill-defined airspace opacity bilaterally concerning for atypical organism pneumonia. Check of COVID-19 status advised. Right base atelectasis. No consolidation or edema. Heart size normal.  No adenopathy appreciable. Aortic Atherosclerosis (ICD10-I70.0). Electronically Signed   By: Lowella Grip III M.D.   On: 12/21/2020 14:49    EKG: Independently reviewed.  Sinus tachycardia 108 bpm.  No QRS widening no obviously peaked T waves.  Assessment/Plan Principal Problem:   Acute hypoxemic respiratory failure due to COVID-19 [facility]) Active Problems:   Acute renal failure (ARF) (HCC)   Hypernatremia   Hyperkalemia   High anion gap metabolic acidosis   Encephalopathy   HLD (hyperlipidemia)  Acute hypoxic respiratory failure  due to COVID-19 > Known Covid +2 weeks ago and had finished isolation but has had 1 day of weakness and found to be hypoxic > Concern for developed Covid pneumonia considering ill-defined airspace disease bilaterally on chest x-ray > We will see what D-dimer comes back as, options are limited for evaluation of PE given acute renal failure and Covid status may be able to try VQ scan if needed > Requiring 4L to maintain saturations, bilateral opacities on chest x-ray - Continue Decadron - Continue remdesivir - Pulmonary hygiene - As needed cough suppressant - Zinc, vitamin C - As needed inhaler - Check procalcitonin, D-dimer, ferritin, fibrinogen, CRP - Daily CBC, CMP, CRP, D-dimer, ferritin, mag, Phos  Hyperkalemia to 5.6 Hyponatremia to 152 AGMA with gap of 17 in the setting of BUN 135 Acute renal failure > Creatinine elevated to 9.59 with normal creatinine 6 months ago > Possibility of prerenal given extreme elevation in BUN and likely decreased p.o. intake in possibly demented patient. > Nephrology consulted in ED appreciate their assistance with this  patient - Continue isotonic fluids for now per nephrology with transition to D5W if fluid resuscitation does not improve hyponatremia - Follow-up renal ultrasound and urinalysis - Plan for n.p.o. at midnight in case he does not respond to fluids - Monitor potassium, can consider Lokelma if does not respond to fluid resuscitation - Every 4 hours BMP  Altered mental status > Suspect uremia in the setting of BUN 135 and COVID, on his baseline of dementia (no formal evaluation per family, but poor memory) >  Per nursing reports  is typically quiet but able to converseand is nonconversant at this time.  When I asked him to nod his head yes and to say yes he does do it but he speaks quietly.  Does not follow commands or have conversation. - Correct renal function/BUN and reevaluate - Consider CT head considering history of CVA if symptoms worsen or fail to improve.  ?History of CVA Hyperlipidemia - Continue home statin  Hypertension - Hold home amlodipine  DVT prophylaxis: Heparin  Code Status:   Full, for now, will discuss with family Family Communication:  Trenton Founds updated by phone  Disposition Plan:   Patient is from:  Nursing home  Anticipated DC to:  Nursing home  Anticipated DC date:  Pending clinical course  Anticipated DC barriers: None  Consults called:  Nephrology, Dr. Joylene Grapes consulted by EDP Admission status:  Inpatient, progressive   Severity of Illness: The appropriate patient status for this patient is INPATIENT. Inpatient status is judged to be reasonable and necessary in order to provide the required intensity of service to ensure the patient's safety. The patient's presenting symptoms, physical exam findings, and initial radiographic and laboratory data in the context of their chronic comorbidities is felt to place them at high risk for further clinical deterioration. Furthermore, it is not anticipated that the patient will be medically stable for discharge from the  hospital within 2 midnights of admission. The following factors support the patient status of inpatient.   " The patient's presenting symptoms include shortness of breath. " The worrisome physical exam findings include trace rales, altered mental status. " The initial radiographic and laboratory data are worrisome because of sodium 152, potassium 5.6, BUN 135, bicarb 19, gap 17, creatinine 9.59 from normal baseline 6 months ago.  Chest x-ray with areas of diffuse opacity consistent with Covid pneumonia.. " The chronic co-morbidities include hypertension, hyperlipidemia possible history of stroke.Marland Kitchen   *  I certify that at the point of admission it is my clinical judgment that the patient will require inpatient hospital care spanning beyond 2 midnights from the point of admission due to high intensity of service, high risk for further deterioration and high frequency of surveillance required.Marcelyn Bruins MD [facility]  How to contact the [facility] Attending or Consulting provider [place] or covering provider during after hours [place], for this patient?   1. Check the care team in [facility] and look for a) attending/consulting [facility] provider listed and b) the [facility] team listed 2. Log into [email or web address] and use [facility]'s universal password to access. If you do not have the password, please contact the hospital operator. 3. Locate the [facility] provider you are looking for under [facility] and page to a number that you can be directly reached. 4. If you still have difficulty reaching the provider, please page the [facility] (Director on Call) for the Hospitalists listed on amion for assistance.  12/21/2020, 9:45 PM

## 2020-12-22 DIAGNOSIS — J9601 Acute respiratory failure with hypoxia: Secondary | ICD-10-CM

## 2020-12-22 DIAGNOSIS — U071 COVID-19: Principal | ICD-10-CM

## 2020-12-22 LAB — COMPREHENSIVE METABOLIC PANEL
ALT: 27 U/L (ref 0–44)
AST: 32 U/L (ref 15–41)
Albumin: 2.3 g/dL — ABNORMAL LOW (ref 3.5–5.0)
Alkaline Phosphatase: 59 U/L (ref 38–126)
Anion gap: 14 (ref 5–15)
BUN: 136 mg/dL — ABNORMAL HIGH (ref 8–23)
CO2: 17 mmol/L — ABNORMAL LOW (ref 22–32)
Calcium: 8.1 mg/dL — ABNORMAL LOW (ref 8.9–10.3)
Chloride: 122 mmol/L — ABNORMAL HIGH (ref 98–111)
Creatinine, Ser: 9.6 mg/dL — ABNORMAL HIGH (ref 0.61–1.24)
GFR, Estimated: 6 mL/min — ABNORMAL LOW (ref >60)
Glucose, Bld: 149 mg/dL — ABNORMAL HIGH (ref 70–99)
Potassium: 6.4 mmol/L (ref 3.5–5.1)
Sodium: 153 mmol/L — ABNORMAL HIGH (ref 135–145)
Total Bilirubin: 0.5 mg/dL (ref 0.3–1.2)
Total Protein: 7.2 g/dL (ref 6.5–8.1)

## 2020-12-22 LAB — HEPATITIS PANEL, ACUTE
HCV Ab: NONREACTIVE
Hep A IgM: NONREACTIVE
Hep B C IgM: NONREACTIVE
Hepatitis B Surface Ag: NONREACTIVE

## 2020-12-22 LAB — BASIC METABOLIC PANEL
Anion gap: 16 — ABNORMAL HIGH (ref 5–15)
BUN: 135 mg/dL — ABNORMAL HIGH (ref 8–23)
CO2: 15 mmol/L — ABNORMAL LOW (ref 22–32)
Calcium: 8 mg/dL — ABNORMAL LOW (ref 8.9–10.3)
Chloride: 120 mmol/L — ABNORMAL HIGH (ref 98–111)
Creatinine, Ser: 9.62 mg/dL — ABNORMAL HIGH (ref 0.61–1.24)
GFR, Estimated: 6 mL/min — ABNORMAL LOW (ref >60)
Glucose, Bld: 140 mg/dL — ABNORMAL HIGH (ref 70–99)
Potassium: 6.1 mmol/L — ABNORMAL HIGH (ref 3.5–5.1)
Sodium: 151 mmol/L — ABNORMAL HIGH (ref 135–145)

## 2020-12-22 LAB — CBC WITH DIFFERENTIAL/PLATELET
Abs Immature Granulocytes: 0.07 10*3/uL (ref 0.00–0.07)
Basophils Absolute: 0 10*3/uL (ref 0.0–0.1)
Basophils Relative: 0 %
Eosinophils Absolute: 0 10*3/uL (ref 0.0–0.5)
Eosinophils Relative: 0 %
HCT: 41.9 % (ref 39.0–52.0)
Hemoglobin: 12.8 g/dL — ABNORMAL LOW (ref 13.0–17.0)
Immature Granulocytes: 1 %
Lymphocytes Relative: 11 %
Lymphs Abs: 1.2 10*3/uL (ref 0.7–4.0)
MCH: 29 pg (ref 26.0–34.0)
MCHC: 30.5 g/dL (ref 30.0–36.0)
MCV: 94.8 fL (ref 80.0–100.0)
Monocytes Absolute: 0.6 10*3/uL (ref 0.1–1.0)
Monocytes Relative: 6 %
Neutro Abs: 8.6 10*3/uL — ABNORMAL HIGH (ref 1.7–7.7)
Neutrophils Relative %: 82 %
Platelets: 140 10*3/uL — ABNORMAL LOW (ref 150–400)
RBC: 4.42 MIL/uL (ref 4.22–5.81)
RDW: 16 % — ABNORMAL HIGH (ref 11.5–15.5)
WBC: 10.4 10*3/uL (ref 4.0–10.5)
nRBC: 0 % (ref 0.0–0.2)

## 2020-12-22 LAB — GLUCOSE, RANDOM: Glucose, Bld: 165 mg/dL — ABNORMAL HIGH (ref 70–99)

## 2020-12-22 LAB — RENAL FUNCTION PANEL
Albumin: 2.3 g/dL — ABNORMAL LOW (ref 3.5–5.0)
Anion gap: 14 (ref 5–15)
BUN: 141 mg/dL — ABNORMAL HIGH (ref 8–23)
CO2: 16 mmol/L — ABNORMAL LOW (ref 22–32)
Calcium: 8 mg/dL — ABNORMAL LOW (ref 8.9–10.3)
Chloride: 120 mmol/L — ABNORMAL HIGH (ref 98–111)
Creatinine, Ser: 9.34 mg/dL — ABNORMAL HIGH (ref 0.61–1.24)
GFR, Estimated: 6 mL/min — ABNORMAL LOW (ref >60)
Glucose, Bld: 158 mg/dL — ABNORMAL HIGH (ref 70–99)
Phosphorus: 5.2 mg/dL — ABNORMAL HIGH (ref 2.5–4.6)
Potassium: 6.1 mmol/L — ABNORMAL HIGH (ref 3.5–5.1)
Sodium: 150 mmol/L — ABNORMAL HIGH (ref 135–145)

## 2020-12-22 LAB — FERRITIN
Ferritin: 1091 ng/mL — ABNORMAL HIGH (ref 24–336)
Ferritin: 1110 ng/mL — ABNORMAL HIGH (ref 24–336)

## 2020-12-22 LAB — MRSA PCR SCREENING: MRSA by PCR: NEGATIVE

## 2020-12-22 LAB — D-DIMER, QUANTITATIVE
D-Dimer, Quant: 0.87 ug/mL-FEU — ABNORMAL HIGH (ref 0.00–0.50)
D-Dimer, Quant: 1.04 ug/mL-FEU — ABNORMAL HIGH (ref 0.00–0.50)

## 2020-12-22 LAB — C-REACTIVE PROTEIN: CRP: 11.7 mg/dL — ABNORMAL HIGH (ref <1.0)

## 2020-12-22 LAB — PROCALCITONIN: Procalcitonin: 2.5 ng/mL

## 2020-12-22 LAB — LACTATE DEHYDROGENASE: LDH: 268 U/L — ABNORMAL HIGH (ref 98–192)

## 2020-12-22 LAB — FIBRINOGEN: Fibrinogen: 502 mg/dL — ABNORMAL HIGH (ref 210–475)

## 2020-12-22 LAB — PHOSPHORUS: Phosphorus: 6.3 mg/dL — ABNORMAL HIGH (ref 2.5–4.6)

## 2020-12-22 LAB — MAGNESIUM: Magnesium: 2.3 mg/dL (ref 1.7–2.4)

## 2020-12-22 LAB — HIV ANTIBODY (ROUTINE TESTING W REFLEX): HIV Screen 4th Generation wRfx: REACTIVE — AB

## 2020-12-22 MED ORDER — BIOTENE DRY MOUTH MT LIQD: 15.0000 mL | OROMUCOSAL | Status: DC | PRN

## 2020-12-22 MED ORDER — DEXTROSE 5 % IV SOLN: INTRAVENOUS | Status: DC

## 2020-12-22 MED ORDER — ONDANSETRON HCL 4 MG/2ML IJ SOLN: 4.0000 mg | Freq: Four times a day (QID) | INTRAMUSCULAR | Status: DC | PRN

## 2020-12-22 MED ORDER — INSULIN ASPART 100 UNIT/ML IV SOLN
5.0000 [IU] | Freq: Once | INTRAVENOUS | Status: AC
  Administered 2020-12-22: 5 [IU] via INTRAVENOUS

## 2020-12-22 MED ORDER — LORAZEPAM 1 MG PO TABS: 1.0000 mg | ORAL_TABLET | ORAL | Status: DC | PRN

## 2020-12-22 MED ORDER — HYDROMORPHONE HCL 1 MG/ML IJ SOLN
0.5000 mg | INTRAMUSCULAR | Status: DC | PRN
Start: 2020-12-22 — End: 2020-12-26
  Administered 2020-12-25 – 2020-12-26 (×3): 0.5 mg via INTRAVENOUS
  Filled 2020-12-22 (×3): qty 0.5

## 2020-12-22 MED ORDER — OXYCODONE HCL 5 MG PO TABS: 5.0000 mg | ORAL_TABLET | ORAL | Status: DC | PRN

## 2020-12-22 MED ORDER — METHYLPREDNISOLONE SODIUM SUCC 125 MG IJ SOLR
70.0000 mg | Freq: Two times a day (BID) | INTRAMUSCULAR | Status: DC
  Administered 2020-12-22: 70 mg via INTRAVENOUS
  Filled 2020-12-22: qty 2

## 2020-12-22 MED ORDER — HALOPERIDOL LACTATE 5 MG/ML IJ SOLN: 2.0000 mg | INTRAMUSCULAR | Status: DC | PRN

## 2020-12-22 MED ORDER — LORAZEPAM 2 MG/ML IJ SOLN: 1.0000 mg | INTRAMUSCULAR | Status: DC | PRN

## 2020-12-22 MED ORDER — ACETAMINOPHEN 650 MG RE SUPP: 650.0000 mg | Freq: Four times a day (QID) | RECTAL | Status: DC | PRN

## 2020-12-22 MED ORDER — HALOPERIDOL LACTATE 2 MG/ML PO CONC
2.0000 mg | ORAL | Status: DC | PRN
  Filled 2020-12-22: qty 1

## 2020-12-22 MED ORDER — SODIUM ZIRCONIUM CYCLOSILICATE 10 G PO PACK
10.0000 g | PACK | Freq: Every day | ORAL | Status: DC
  Administered 2020-12-22: 10 g via ORAL
  Filled 2020-12-22: qty 1

## 2020-12-22 MED ORDER — SODIUM ZIRCONIUM CYCLOSILICATE 10 G PO PACK: 20.0000 g | PACK | Freq: Once | ORAL | Status: DC

## 2020-12-22 MED ORDER — GLYCOPYRROLATE 1 MG PO TABS
1.0000 mg | ORAL_TABLET | ORAL | Status: DC | PRN
  Filled 2020-12-22: qty 1

## 2020-12-22 MED ORDER — ACETAMINOPHEN 325 MG PO TABS: 650.0000 mg | ORAL_TABLET | Freq: Four times a day (QID) | ORAL | Status: DC | PRN

## 2020-12-22 MED ORDER — DIPHENHYDRAMINE HCL 50 MG/ML IJ SOLN: 12.5000 mg | INTRAMUSCULAR | Status: DC | PRN

## 2020-12-22 MED ORDER — LORAZEPAM 2 MG/ML IJ SOLN
1.0000 mg | INTRAMUSCULAR | Status: DC | PRN
  Administered 2020-12-25: 1 mg via INTRAVENOUS
  Filled 2020-12-22 (×2): qty 1

## 2020-12-22 MED ORDER — CAMPHOR-MENTHOL 0.5-0.5 % EX LOTN
TOPICAL_LOTION | CUTANEOUS | Status: DC | PRN
  Filled 2020-12-22: qty 222

## 2020-12-22 MED ORDER — HALOPERIDOL 1 MG PO TABS
2.0000 mg | ORAL_TABLET | ORAL | Status: DC | PRN
  Filled 2020-12-22: qty 2

## 2020-12-22 MED ORDER — DEXTROSE 50 % IV SOLN
1.0000 | Freq: Once | INTRAVENOUS | Status: AC
  Administered 2020-12-22: 50 mL via INTRAVENOUS
  Filled 2020-12-22: qty 50

## 2020-12-22 MED ORDER — SODIUM CHLORIDE 0.9 % IV SOLN
12.5000 mg | Freq: Four times a day (QID) | INTRAVENOUS | Status: DC | PRN
  Administered 2020-12-22: 12.5 mg via INTRAVENOUS
  Filled 2020-12-22 (×2): qty 0.5

## 2020-12-22 MED ORDER — LORAZEPAM 2 MG/ML PO CONC: 1.0000 mg | ORAL | Status: DC | PRN

## 2020-12-22 MED ORDER — GLYCOPYRROLATE 0.2 MG/ML IJ SOLN: 0.2000 mg | INTRAMUSCULAR | Status: DC | PRN

## 2020-12-22 MED ORDER — ONDANSETRON 4 MG PO TBDP: 4.0000 mg | ORAL_TABLET | Freq: Four times a day (QID) | ORAL | Status: DC | PRN

## 2020-12-22 NOTE — Progress Notes (Signed)
[facility] Progress Note  Patient: Dean Phillips    [id number]  DOA: 12/21/2020     Date of Service: the patient was seen and examined on 12/22/2020  Brief hospital course: Past medical history of CVA, HLD, HTN, CKD 3B.  Presents with complaints of hypoxia and confusion.  Patient is a resident at [facility].  Recently diagnosed with Covid on 1/14.  Did not require oxygen and was in isolation up to 1/24.  Continues to have poor p.o. intake since then. Now has acute kidney injury on CKD 3B likely progressing to ESRD. Nephrology was consulted, felt the patient is not a hemodialysis candidate and recommended goals of care conversation. Currently plan is comfort care.  Assessment and Plan: 1.  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 Work-up showed that the patient's serum creatinine is 9.6, sodium is 151, potassium is 6 with anion gap metabolic acidosis. BUN 130. Nephrology was consulted. Patient was started on IV fluid bolus followed by D5 at 125 cc/h. This morning sodium potassium is unchanged. Mild improvement in serum creatinine but further worsening of BUN and ongoing worsening of metabolic acidosis. Ultrasound renal negative for any obstruction. No urine output so far. Nephrology was consulted and recommended that the patient is not a long-term hemodialysis candidate given his fraility and prior diagnosis of his dementia. Currently comfort care based on the discussion with the family.  2.  Recent COVID-19 infection Diagnosed on 1/14. No fever on presentation. Mild hypoxia on presentation. Chest x-ray mild airspace opacity although no significant abnormality. Currently on room air. CRP mildly elevated although likely can be poor clearance from  worsening renal function. Initially started on remdesivir and steroids. We will discontinue both medication as the patient does not appear to be suffering from acute COVID-19 infection. No further need for isolation. Currently comfort care.  3.  Hyperlipidemia History of CVA On aspirin and Lipitor.  Both currently on hold.  4.  Essential hypertension Blood pressure stable. Currently antihypertensive medications are on hold.  Diet: Renal diet DVT Prophylaxis: Comfort care  Advance goals of care discussion: DNR  Family Communication: no family was present at bedside, at the time of interview.  Discussed with patient's daughter-in-law, daughter, 2 brother and sister-in-law on phone. Opportunity was given to ask question and all questions were answered satisfactorily.   Disposition:  Status is: Inpatient  Remains inpatient appropriate because:Altered mental status and Unsafe d/c plan   Dispo: The patient is from: Home              Anticipated d/c is to: Residential hospice              Anticipated d/c date is: 2 days              Patient currently is not medically stable to d/c.   Difficult to place patient No  Subjective: No nausea no vomiting.  No fever no chills.  Starting to have hiccups.  Did not require any oxygen earlier during the day but as the day progressed needing some oxygen.  Also becomes more confused and lethargic as the day progressed.  Physical Exam:  General: Appear in mild distress, no Rash; Oral Mucosa Clear, dry. no Abnormal Neck Mass Or lumps, Conjunctiva normal  Cardiovascular: S1 and S2 Present, no Murmur, Respiratory: good respiratory effort, Bilateral Air entry present  and faint Crackles, no wheezes Abdomen: Bowel Sound present, Soft and no tenderness Extremities: no Pedal edema Neurology: alert and not oriented to time, place, and person affect flat in affect. no new focal deficit Gait not checked due to patient safety concerns  Vitals:    12/22/20 0754 12/22/20 0800 12/22/20 1200 12/22/20 1600  BP: (!) 159/103 (!) 155/85 (!) 145/79 132/78  Pulse: 74  65 63  Resp: '15 13 13 14  '$ Temp: (!) [id number] F (36.4 C)  (!) 97.1 F (36.2 C) (!) 97.2 F (36.2 C)  TempSrc: Axillary  Axillary Axillary  SpO2: 95%  90% 91%    Intake/Output Summary (Last 24 hours) at 12/22/2020 2037 Last data filed at 12/22/2020 1858 Gross per 24 hour  Intake 1465.97 ml  Output -  Net 1465.97 ml   There were no vitals filed for this visit.  Data Reviewed: I have personally reviewed and interpreted daily labs, tele strips, imaging. I reviewed all nursing notes, pharmacy notes, vitals, pertinent old records I have discussed plan of care as described above with RN and patient/family.  CBC: Recent Labs  Lab 12/21/20 1335 12/22/20 0445  WBC 10.2 10.4  NEUTROABS 8.6* 8.6*  HGB 14.7 12.8*  HCT 45.6 41.9  MCV 93.1 94.8  PLT 179 [id number]*   Basic Metabolic Panel: Recent Labs  Lab 12/21/20 1616 12/22/20 0250 12/22/20 0445 12/22/20 0912 12/22/20 1114  NA 152* 151* 153*  --  150*  K 5.6* 6.1* 6.4*  --  6.1*  CL 116* 120* 122*  --  120*  CO2 19* 15* 17*  --  16*  GLUCOSE 132* 140* 149* 165* 158*  BUN 133* 135* 136*  --  141*  CREATININE 9.59* 9.62* 9.60*  --  9.34*  CALCIUM 8.4* 8.0* 8.1*  --  8.0*  MG  --   --  2.3  --   --   PHOS  --   --  6.3*  --  5.2*    Studies: No results found.  Scheduled Meds: . sodium chloride flush  3 mL Intravenous Q12H   Continuous Infusions: . chlorproMAZINE (THORAZINE) IV     PRN Meds: acetaminophen **OR** acetaminophen, antiseptic oral rinse, camphor-menthol, chlorproMAZINE (THORAZINE) IV, diphenhydrAMINE, glycopyrrolate **OR** glycopyrrolate **OR** glycopyrrolate, haloperidol **OR** haloperidol **OR** haloperidol lactate, HYDROmorphone (DILAUDID) injection, LORazepam **OR** LORazepam **OR** LORazepam, LORazepam, ondansetron **OR** ondansetron (ZOFRAN) IV, oxyCODONE  Time spent: 35 minutes  Author: Berle Mull, MD [facility] 12/22/2020 8:37 PM  To reach On-call, see care teams to locate the attending and reach out via [email or web address]. Between 7PM-7AM, please contact night-coverage If you still have difficulty reaching the attending provider, please page the [facility] (Director on Call) for [facility] on amion for assistance.

## 2020-12-22 NOTE — ED Notes (Signed)
MD Joylene Grapes at bedside evaluating patient.

## 2020-12-22 NOTE — Progress Notes (Signed)
Nephrology follow up note:   Reviewed overnight chart from Dr. Joylene Grapes.    Pt has been been volume resuscitated with isotonic bolus and has been receiving D5W hydration.  Repeat labs this morning with no improvement.  Exam shows normotensive, Tm 99.9, RRR.  Sats ok on 4Lnc.  MM tachy, no rub, no edema, thin, abd benign.  He remains obtunded and anuric.   Spoke to [place] for full update; she asked I call brother Marc Morgans so I spoke to him and wife who is retired Engineer, drilling.    Given his baseline frail status I think he's a very poor dialysis candidate and I think his outcome will be poor with or without dialysis.  I recommended against it but said we could consider a time limited trial.  Family would like to discuss.   Recheck labs at 11am; his hyperkalemia is difficult to manage without oral access at this point.  I plan to check back in with SIL [place] after labs return and hear the family's decision.   Call me with concerns in the meantime.

## 2020-12-22 NOTE — Progress Notes (Signed)
I spoke to patient's SIL again who has been his caretaker for the past 3 years.    Updated her that renal function has not improved with continue fluids.  Appears O2 sats have declined some.   I recommended against dialysis as I don't think it will change his outcome and he is a very poor candidate given baseline frail status.   She acknowledged this.  She plans to further discuss his code status with family and plans to speak with the hospitalist about this later today.  I communicated the urgency of the situation to her.    I would continue supportive care as you are for this unfortunate gentleman but I don't have anything else to offer.  Continue to address goals of care.

## 2020-12-22 NOTE — Progress Notes (Signed)
      INFECTIOUS DISEASE ATTENDING ADDENDUM:   Date: 12/22/2020  Patient name: Dean Phillips  Medical record number: [id number]  Date of birth: Apr 06, 1956   I was notified via epic of his initial HIV positive test.  Patient currently being treated for Covid multiple ongoing medical problems.  I will order an HIV quantitative RNA given the slow turnaround time of the discriminatory assay.  If discrim assay is + or RNA we will formally see him      Alcide Evener 12/22/2020, 11:47 AM

## 2020-12-22 NOTE — Progress Notes (Signed)
Dean Phillips  Code Status: FULL  Dean Phillips is a 65 y.o. male patient admitted from ED awake, alert - but does not respond to orientation questions - no acute distress noted. VSS -  no c/o shortness of breath, no c/o chest pain. Cardiac tele in place. Orientation to room and floor completed with information packet given to patient. Fall assessment complete. Call light within reach. Skin, clean-dry- intact, has a small healed abrasion to right lateral ankle.  No evidence of skin break down noted on exam.  ?  Will cont to eval and treat per MD orders.  Melonie Florida, RN

## 2020-12-22 NOTE — Progress Notes (Addendum)
CSW received request to see when patient tested positive for COVID at [facility]. Per Admissions, he tested positive on 12/08/20 and stayed in quarantine 10 days. Copy of test on hard chart.   Courtland Coppa LCSW

## 2020-12-22 NOTE — ED Notes (Signed)
Report called and given to inpatient RN

## 2020-12-23 DIAGNOSIS — U071 COVID-19: Secondary | ICD-10-CM | POA: Diagnosis not present

## 2020-12-23 DIAGNOSIS — J9601 Acute respiratory failure with hypoxia: Secondary | ICD-10-CM | POA: Diagnosis not present

## 2020-12-23 LAB — HIV-1 RNA QUANT-NO REFLEX-BLD
HIV 1 RNA Quant: 27300 copies/mL
LOG10 HIV-1 RNA: 4.436 log10copy/mL

## 2020-12-23 MED ORDER — SODIUM CHLORIDE 0.9 % IV SOLN
12.5000 mg | Freq: Three times a day (TID) | INTRAVENOUS | Status: DC
  Administered 2020-12-23 – 2020-12-24 (×4): 12.5 mg via INTRAVENOUS
  Filled 2020-12-23 (×6): qty 0.5

## 2020-12-23 NOTE — Progress Notes (Addendum)
[facility] Progress Note  Patient: Dean Phillips    [id number]  DOA: 12/21/2020     Date of Service: the patient was seen and examined on 12/23/2020  Brief hospital course: Past medical history of CVA, HLD, HTN, CKD 3B.  Presents with complaints of hypoxia and confusion.  Patient is a resident at [facility].  Recently diagnosed with Covid on 1/14.  Did not require oxygen and was in isolation up to 1/24.  Continues to have poor p.o. intake since then. Now has acute kidney injury on CKD 3B likely progressing to ESRD. Nephrology was consulted, felt the patient is not a hemodialysis candidate and recommended goals of care conversation. Currently plan is comfort care.  Assessment and Plan: 1.  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 Work-up showed that the patient's serum creatinine is 9.6, sodium is 151, potassium is 6 with anion gap metabolic acidosis. BUN 130. Nephrology was consulted. Patient was started on IV fluid bolus followed by D5 at 125 cc/h. This morning sodium potassium is unchanged. Mild improvement in serum creatinine but further worsening of BUN and ongoing worsening of metabolic acidosis. Ultrasound renal negative for any obstruction. No urine output so far. Nephrology was consulted and recommended that the patient is not a long-term hemodialysis candidate given his fraility and prior diagnosis of his dementia. Currently comfort care based on the discussion with the family.  2.  Recent COVID-19 infection Diagnosed on 1/14. No fever on presentation. Mild hypoxia on presentation. Chest x-ray mild airspace opacity although no significant abnormality. Currently on room air. CRP mildly elevated although likely can be poor clearance from  worsening renal function. Initially started on remdesivir and steroids. We will discontinue both medication as the patient does not appear to be suffering from acute COVID-19 infection. No further need for isolation. Currently comfort care.  3.  Hyperlipidemia History of CVA On aspirin and Lipitor.  Both currently on hold.  4.  Essential hypertension Blood pressure stable. Currently antihypertensive medications are on hold.  5.  Goals of care conversation. Extensive conversation with family on phone on 1/28. Not a good candidate for hemodialysis presents with hypernatremia, hyperkalemia, metabolic acidosis, altered mental status. Recommended transition to comfort care which family agreed to. Patient will require residential hospice. Social worker consulted. Family prefers residential hospice in [place] area. Scheduled Thorazine for hiccups  6.  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 Hepatitis panel negative. 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 Currently comfort care. Family not infromed about his diagnosis.  Diet: Renal diet DVT Prophylaxis: Comfort care  Advance goals of care discussion: DNR  Family Communication: no family was present at bedside, at the time of interview.  Discussed with patient's daughter-in-law, daughter, 2 brother and sister-in-law on phone. Opportunity was given to ask question and all questions were answered satisfactorily.   Disposition:  Status is: Inpatient  Remains inpatient appropriate because:Altered mental status and Unsafe d/c plan  Dispo: The patient is from: Home              Anticipated d/c is to: Residential hospice              Anticipated d/c date is: 2 days              Patient currently is not medically stable to d/c.   Difficult to place  patient No  Subjective: More fatigue. No Nausea no vomiting.  Appears to have ongoing hiccups.  Did not receive any medication yesterday for comfort.  Physical Exam:  General:  Appear in mild distress, no Rash; Oral Mucosa Clear, dry. no Abnormal Neck Mass Or lumps, Conjunctiva normal  Cardiovascular: S1 and S2 Present, no Murmur, Respiratory: good respiratory effort, Bilateral Air entry present and faint Crackles, no wheezes Abdomen: Bowel Sound present, Soft and no tenderness Extremities: no Pedal edema Neurology: alert and not oriented to time, place, and person affect flat in affect. no new focal deficit Gait not checked due to patient safety concerns  Vitals:   12/22/20 0800 12/22/20 1200 12/22/20 1600 12/22/20 2346  BP: (!) 155/85 (!) 145/79 132/78 111/67  Pulse:  65 63 77  Resp: '13 13 14 18  '$ Temp:  (!) 97.1 F (36.2 C) (!) 97.2 F (36.2 C) (!) 97.2 F (36.2 C)  TempSrc:  Axillary Axillary Axillary  SpO2:  90% 91% 93%    Intake/Output Summary (Last 24 hours) at 12/23/2020 1813 Last data filed at 12/22/2020 2220 Gross per 24 hour  Intake 106.09 ml  Output --  Net 106.09 ml   There were no vitals filed for this visit.  Data Reviewed: I have personally reviewed and interpreted daily labs, tele strips, imaging. I reviewed all nursing notes, pharmacy notes, vitals, pertinent old records I have discussed plan of care as described above with RN and patient/family.  CBC: Recent Labs  Lab 12/21/20 1335 12/22/20 0445  WBC 10.2 10.4  NEUTROABS 8.6* 8.6*  HGB 14.7 12.8*  HCT 45.6 41.9  MCV 93.1 94.8  PLT 179 [id number]*   Basic Metabolic Panel: Recent Labs  Lab 12/21/20 1616 12/22/20 0250 12/22/20 0445 12/22/20 0912 12/22/20 1114  NA 152* 151* 153*  --  150*  K 5.6* 6.1* 6.4*  --  6.1*  CL 116* 120* 122*  --  120*  CO2 19* 15* 17*  --  16*  GLUCOSE 132* 140* 149* 165* 158*  BUN 133* 135* 136*  --  141*  CREATININE 9.59* 9.62* 9.60*  --  9.34*  CALCIUM 8.4* 8.0* 8.1*  --  8.0*  MG  --   --  2.3  --   --   PHOS  --   --  6.3*  --  5.2*    Studies: No results found.  Scheduled Meds: . sodium chloride flush  3 mL Intravenous Q12H    Continuous Infusions: . chlorproMAZINE (THORAZINE) IV 12.5 mg (12/22/20 2220)  . chlorproMAZINE (THORAZINE) IV 12.5 mg (12/23/20 1554)   PRN Meds: acetaminophen **OR** acetaminophen, antiseptic oral rinse, camphor-menthol, chlorproMAZINE (THORAZINE) IV, diphenhydrAMINE, glycopyrrolate **OR** glycopyrrolate **OR** glycopyrrolate, haloperidol **OR** haloperidol **OR** haloperidol lactate, HYDROmorphone (DILAUDID) injection, LORazepam **OR** LORazepam **OR** LORazepam, LORazepam, ondansetron **OR** ondansetron (ZOFRAN) IV, oxyCODONE  Time spent: 35 minutes  Author: Berle Mull, MD [facility] 12/23/2020 6:13 PM  To reach On-call, see care teams to locate the attending and reach out via [email or web address]. Between 7PM-7AM, please contact night-coverage If you still have difficulty reaching the attending provider, please page the [facility] (Director on Call) for [facility] on amion for assistance.

## 2020-12-24 DIAGNOSIS — U071 COVID-19: Secondary | ICD-10-CM | POA: Diagnosis not present

## 2020-12-24 DIAGNOSIS — J9601 Acute respiratory failure with hypoxia: Secondary | ICD-10-CM | POA: Diagnosis not present

## 2020-12-24 MED ORDER — LORAZEPAM 2 MG/ML IJ SOLN
1.0000 mg | Freq: Once | INTRAMUSCULAR | Status: AC
  Administered 2020-12-24: 1 mg via INTRAVENOUS
  Filled 2020-12-24: qty 1

## 2020-12-24 MED ORDER — LORAZEPAM 2 MG/ML IJ SOLN
1.0000 mg | Freq: Three times a day (TID) | INTRAMUSCULAR | Status: DC
  Administered 2020-12-24 – 2020-12-26 (×4): 1 mg via INTRAVENOUS
  Filled 2020-12-24 (×3): qty 1

## 2020-12-24 NOTE — Progress Notes (Addendum)
[facility] Progress Note  Patient: Dean Phillips    [id number]  DOA: 12/21/2020     Date of Service: the patient was seen and examined on 12/24/2020  Brief hospital course: Past medical history of CVA, HLD, HTN, CKD 3B.  Presents with complaints of hypoxia and confusion.  Patient is a resident at [facility].  Recently diagnosed with Covid on 1/14.  Did not require oxygen and was in isolation up to 1/24.  Continues to have poor p.o. intake since then. Now has acute kidney injury on CKD 3B likely progressing to ESRD. Nephrology was consulted, felt the patient is not a hemodialysis candidate and recommended goals of care conversation. Prognosis is less than 2 week given his end stage renal disease and no PO intake.  Currently plan is comfort care transfer to residential hospice.  Assessment and Plan: 1.  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 Work-up showed that the patient's serum creatinine is 9.6, sodium is 151, potassium is 6 with anion gap metabolic acidosis. BUN 130. Nephrology was consulted. Patient was started on IV fluid bolus followed by D5 at 125 cc/h. This morning sodium potassium is unchanged. Mild improvement in serum creatinine but further worsening of BUN and ongoing worsening of metabolic acidosis. Ultrasound renal negative for any obstruction. No urine output so far. Nephrology was consulted and recommended that the patient is not a long-term hemodialysis candidate given his fraility and prior diagnosis of his dementia. Currently comfort care based on the discussion with the family.  2.  Recent COVID-19 infection Diagnosed on 1/14. No fever on presentation. Mild hypoxia on presentation. Chest x-ray mild airspace opacity although no  significant abnormality. Currently on room air. CRP mildly elevated although likely can be poor clearance from worsening renal function. Initially started on remdesivir and steroids. We will discontinue both medication as the patient does not appear to be suffering from acute COVID-19 infection. No further need for isolation. Currently comfort care.  3.  Hyperlipidemia History of CVA On aspirin and Lipitor.  Both currently on hold.  4.  Essential hypertension Blood pressure stable. Currently antihypertensive medications are on hold.  5.  Goals of care conversation. Extensive conversation with family on phone on 1/28. Not a good candidate for hemodialysis presents with hypernatremia, hyperkalemia, metabolic acidosis, altered mental status. Recommended transition to comfort care which family agreed to. Patient will require residential hospice. Social worker consulted. Family prefers residential hospice in [place] area. Scheduled Ativan with as needed Thorazine for hiccups.  6.  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 Hepatitis panel negative. 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 Currently comfort care. Family not infromed about his diagnosis.  Diet: Renal diet DVT Prophylaxis: Comfort care  Advance goals of care discussion: DNR  Family Communication: no family was present at bedside, at the time of interview.  Discussed with patient's daughter-in-law, daughter, 2 brother and sister-in-law on phone. Opportunity was given to ask question and all questions were answered satisfactorily.   Disposition:  Status is: Inpatient  Remains inpatient appropriate because:Altered mental status and Unsafe d/c plan  Dispo: The patient is from: Home              Anticipated d/c is to: Residential hospice              Anticipated d/c date is: 2 days  Patient currently is not medically stable to d/c.   Difficult to place patient No  Subjective: No nausea no  vomiting.  Continues to have a cough.  No pain.  No acute events overnight.  Physical Exam:  General: Appear in mild distress, no Rash; Cardiovascular: S1 and S2 Present, no Murmur, Respiratory: good respiratory effort, Bilateral Air entry present and CTA, no Crackles, no wheezes Abdomen: Bowel Sound present,  Neurology: Lethargic and not oriented to time, place, and person  Vitals:   12/22/20 1600 12/22/20 2346 12/24/20 0100 12/24/20 1610  BP: 132/78 111/67 135/82 (!) 141/92  Pulse: 63 77 64 96  Resp: '14 18 16 18  '$ Temp: (!) 97.2 F (36.2 C) (!) 97.2 F (36.2 C) 97.7 F (36.5 C) 97.9 F (36.6 C)  TempSrc: Axillary Axillary Axillary Axillary  SpO2: 91% 93% 95% 90%    Intake/Output Summary (Last 24 hours) at 12/24/2020 1922 Last data filed at 12/24/2020 1800 Gross per 24 hour  Intake 125 ml  Output -  Net 125 ml   There were no vitals filed for this visit.  Data Reviewed: I have personally reviewed and interpreted daily labs, tele strips, imaging. I reviewed all nursing notes, pharmacy notes, vitals, pertinent old records I have discussed plan of care as described above with RN and patient/family.  CBC: Recent Labs  Lab 12/21/20 1335 12/22/20 0445  WBC 10.2 10.4  NEUTROABS 8.6* 8.6*  HGB 14.7 12.8*  HCT 45.6 41.9  MCV 93.1 94.8  PLT 179 [id number]*   Basic Metabolic Panel: Recent Labs  Lab 12/21/20 1616 12/22/20 0250 12/22/20 0445 12/22/20 0912 12/22/20 1114  NA 152* 151* 153*  --  150*  K 5.6* 6.1* 6.4*  --  6.1*  CL 116* 120* 122*  --  120*  CO2 19* 15* 17*  --  16*  GLUCOSE 132* 140* 149* 165* 158*  BUN 133* 135* 136*  --  141*  CREATININE 9.59* 9.62* 9.60*  --  9.34*  CALCIUM 8.4* 8.0* 8.1*  --  8.0*  MG  --   --  2.3  --   --   PHOS  --   --  6.3*  --  5.2*    Studies: No results found.  Scheduled Meds: . LORazepam  1 mg Intravenous TID  . sodium chloride flush  3 mL Intravenous Q12H   Continuous Infusions: . chlorproMAZINE (THORAZINE) IV 12.5 mg  (12/22/20 2220)   PRN Meds: acetaminophen **OR** acetaminophen, antiseptic oral rinse, camphor-menthol, chlorproMAZINE (THORAZINE) IV, diphenhydrAMINE, glycopyrrolate **OR** glycopyrrolate **OR** glycopyrrolate, haloperidol **OR** haloperidol **OR** haloperidol lactate, HYDROmorphone (DILAUDID) injection, LORazepam **OR** LORazepam **OR** LORazepam, LORazepam, ondansetron **OR** ondansetron (ZOFRAN) IV, oxyCODONE  Time spent: 35 minutes  Author: Berle Mull, MD [facility] 12/24/2020 7:22 PM  To reach On-call, see care teams to locate the attending and reach out via [email or web address]. Between 7PM-7AM, please contact night-coverage If you still have difficulty reaching the attending provider, please page the [facility] (Director on Call) for [facility] on amion for assistance.

## 2020-12-24 NOTE — Plan of Care (Signed)
Patient is currently resting in bed. VSS. Remains on room air. Q2 turns. Offered something to drink and eat throughout the night. Patient refused. Call bell within reach. Bed alarm on.   Problem: Education: Goal: Knowledge of risk factors and measures for prevention of condition will improve Outcome: Progressing   Problem: Coping: Goal: Psychosocial and spiritual needs will be supported Outcome: Progressing   Problem: Respiratory: Goal: Will maintain a patent airway Outcome: Progressing Goal: Complications related to the disease process, condition or treatment will be avoided or minimized Outcome: Progressing

## 2020-12-24 NOTE — TOC Progression Note (Addendum)
Transition of Care [facility]) - Progression Note    Patient Details  Name: Dean Phillips MRN: [id number] Date of Birth: Feb 21, 1956  Transition of Care [facility]) CM/SW [place], [place] Phone Number: 12/24/2020, 10:05 AM  Clinical Narrative:     CSW spoke with [place] from [place]. [employer] in [place] 10:30am  Someone will contact CSW concerning admission for pt.  TOC Team will continue to assist with disposition planning. 3:30pm CSW followed up with phone call to facility.  Spoke with receptionist.  Receptionist will send message to nurse for return call.       Expected Discharge Plan and Services                                                 Social Determinants of Health (SDOH) Interventions    Readmission Risk Interventions No flowsheet data found.

## 2020-12-25 DIAGNOSIS — J9601 Acute respiratory failure with hypoxia: Secondary | ICD-10-CM | POA: Diagnosis not present

## 2020-12-25 DIAGNOSIS — U071 COVID-19: Secondary | ICD-10-CM | POA: Diagnosis not present

## 2020-12-25 LAB — HIV-1/2 AB - DIFFERENTIATION
HIV 1 Ab: REACTIVE — AB
HIV 2 Ab: NONREACTIVE

## 2020-12-25 NOTE — Progress Notes (Signed)
Around 2130: During shift assessment patient seemed in distress, pulling at gown, hyperventilating.   Patient placed on 2L, Vitals obtained. Monitor unable to read pulse ox well due to patient moving around. Patient remains on 2L for comfort. Given scheduled ativan which helped for some time. Temp 97.61F, warmed blankets placed on patient. Attempting oral care for dry lips, patient moving face away from staff when trying to provide oral care or give him something to drink.   Around 0100: Patient calmed down for awhile.  Whenever checked on, he would start hyperventilating again. Patient repositioned numerous times. Dilaudid given early this AM to help with dyspnea.   Respirations were at 35 (manual), after dilaudid Respirations at 24 (manual), with intermittent hiccups throughout the night.    02:24 Patient currently resting in bed comfortably.

## 2020-12-25 NOTE — Plan of Care (Signed)
  Problem: Education: Goal: Knowledge of risk factors and measures for prevention of condition will improve 12/25/2020 0655 by Karis Juba, RN Outcome: Not Progressing   Problem: Coping: Goal: Psychosocial and spiritual needs will be supported 12/25/2020 0655 by Karis Juba, RN Outcome: Not Progressing   Problem: Respiratory: Goal: Will maintain a patent airway 12/25/2020 0655 by Karis Juba, RN Outcome: Not Progressing   Problem: Respiratory: Goal: Complications related to the disease process, condition or treatment will be avoided or minimized 12/25/2020 0655 by Karis Juba, RN Outcome: Not Progressing

## 2020-12-25 NOTE — TOC Progression Note (Signed)
Transition of Care [facility]) - Progression Note    Patient Details  Name: Dean Phillips MRN: [id number] Date of Birth: 1956/07/11  Transition of Care [facility]) CM/SW [place], LCSW Phone Number: 12/25/2020, 9:00 AM  Clinical Narrative:    CSW contacted [place] to follow up on referral. Per Cassandra, they still need clinicals including something from the MD stating 2 weeks or less prognosis. CSW faxed available clinicals and requested note from MD.         Expected Discharge Plan and Services                                                 Social Determinants of Health (SDOH) Interventions    Readmission Risk Interventions No flowsheet data found.

## 2020-12-25 NOTE — Progress Notes (Signed)
[facility] Progress Note  Patient: Dean Phillips    [id number]  DOA: 12/21/2020     Date of Service: the patient was seen and examined on 12/25/2020  Brief hospital course: Past medical history of CVA, HLD, HTN, CKD 3B.  Presents with complaints of hypoxia and confusion.  Patient is a resident at [facility].  Recently diagnosed with Covid on 1/14.  Did not require oxygen and was in isolation up to 1/24.  Continues to have poor p.o. intake since then. Now has acute kidney injury on CKD 3B likely progressing to ESRD. Nephrology was consulted, felt the patient is not a hemodialysis candidate and recommended goals of care conversation. Prognosis is less than 2 week given his end stage renal disease and no PO intake.  Currently plan is comfort care transfer to residential hospice.  Assessment and Plan: 1.  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 Work-up showed that the patient's serum creatinine is 9.6, sodium is 151, potassium is 6 with anion gap metabolic acidosis. BUN 130. Nephrology was consulted. Patient was started on IV fluid bolus followed by D5 at 125 cc/h. This morning sodium potassium is unchanged. Mild improvement in serum creatinine but further worsening of BUN and ongoing worsening of metabolic acidosis. Ultrasound renal negative for any obstruction. No urine output so far. Nephrology was consulted and recommended that the patient is not a long-term hemodialysis candidate given his fraility and prior diagnosis of his dementia. Currently comfort care based on the discussion with the family. Awaiting a bed at the residential hospice facility. Hiccups resolved with scheduled Ativan.  We will continue the same for now.  2.  Recent COVID-19  infection Diagnosed on 1/14. No fever on presentation. Mild hypoxia on presentation. Chest x-ray mild airspace opacity although no significant abnormality. Currently on room air. CRP mildly elevated although likely can be poor clearance from worsening renal function. Initially started on remdesivir and steroids. We will discontinue both medication as the patient does not appear to be suffering from acute COVID-19 infection. No further need for isolation. Currently comfort care.  3.  Hyperlipidemia History of CVA On aspirin and Lipitor.  Both currently on hold.  4.  Essential hypertension Blood pressure stable. Currently antihypertensive medications are on hold.  5.  Goals of care conversation. Extensive conversation with family on phone on 1/28. Not a good candidate for hemodialysis presents with hypernatremia, hyperkalemia, metabolic acidosis, altered mental status. Recommended transition to comfort care which family agreed to. Patient will require residential hospice. Social worker consulted. Family prefers residential hospice in [place] area. Scheduled Ativan with as needed Thorazine for hiccups.  6.  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 Hepatitis panel negative. 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 Currently comfort care. Family not infromed about his diagnosis.  Diet: Renal diet DVT Prophylaxis: Comfort care  Advance goals of care discussion: DNR  Family Communication: no family was present at bedside, at the time of interview.  Discussed with daughter-in-law on 1/30. Discussed with rest of the family on 1/28.  Disposition:  Status is: Inpatient  Remains inpatient appropriate because: Needs to go to residential hospice  Dispo: The patient is from: Home              Anticipated d/c is to: Residential hospice  Anticipated d/c date is: 1 day              Patient currently medically stable   Difficult to place patient No  Subjective:  No acute events.  No nausea no vomiting.  Physical Exam:  General: Mild distress.  Dry mouth. Cardiovascular: S1-S2 present.   Respiratory: Bilateral crackles. Abdomen: Bowel Sound present but sluggish Neurology: Remains lethargic. No further hiccups.    Vitals:   12/24/20 1610 12/24/20 2125 12/25/20 1221 12/25/20 2031  BP: (!) 141/92 (!) 155/96 131/74 134/80  Pulse: 96 99 (!) 119 61  Resp: 18 (!) 35 (!) 22 (!) 32  Temp: 97.9 F (36.6 C) (!) 97.4 F (36.3 C) 98.5 F (36.9 C)   TempSrc: Axillary Axillary Axillary   SpO2: 90% 93% (!) 88% 94%   No intake or output data in the 24 hours ending 12/25/20 2103 There were no vitals filed for this visit.  Data Reviewed: I have personally reviewed and interpreted daily labs, tele strips, imaging. I reviewed all nursing notes, pharmacy notes, vitals, pertinent old records I have discussed plan of care as described above with RN and patient/family.  CBC: Recent Labs  Lab 12/21/20 1335 12/22/20 0445  WBC 10.2 10.4  NEUTROABS 8.6* 8.6*  HGB 14.7 12.8*  HCT 45.6 41.9  MCV 93.1 94.8  PLT 179 [id number]*   Basic Metabolic Panel: Recent Labs  Lab 12/21/20 1616 12/22/20 0250 12/22/20 0445 12/22/20 0912 12/22/20 1114  NA 152* 151* 153*  --  150*  K 5.6* 6.1* 6.4*  --  6.1*  CL 116* 120* 122*  --  120*  CO2 19* 15* 17*  --  16*  GLUCOSE 132* 140* 149* 165* 158*  BUN 133* 135* 136*  --  141*  CREATININE 9.59* 9.62* 9.60*  --  9.34*  CALCIUM 8.4* 8.0* 8.1*  --  8.0*  MG  --   --  2.3  --   --   PHOS  --   --  6.3*  --  5.2*    Studies: No results found.  Scheduled Meds: . LORazepam  1 mg Intravenous TID  . sodium chloride flush  3 mL Intravenous Q12H   Continuous Infusions: . chlorproMAZINE (THORAZINE) IV 12.5 mg (12/22/20 2220)   PRN Meds: acetaminophen **OR** acetaminophen, antiseptic oral rinse, camphor-menthol, chlorproMAZINE (THORAZINE) IV, diphenhydrAMINE, glycopyrrolate **OR** glycopyrrolate **OR** glycopyrrolate,  haloperidol **OR** haloperidol **OR** haloperidol lactate, HYDROmorphone (DILAUDID) injection, LORazepam **OR** LORazepam **OR** LORazepam, LORazepam, ondansetron **OR** ondansetron (ZOFRAN) IV, oxyCODONE  Time spent: 35 minutes  Author: Berle Mull, MD [facility] 12/25/2020 9:03 PM  To reach On-call, see care teams to locate the attending and reach out via [email or web address]. Between 7PM-7AM, please contact night-coverage If you still have difficulty reaching the attending provider, please page the [facility] (Director on Call) for [facility] on amion for assistance.

## 2020-12-26 DIAGNOSIS — U071 COVID-19: Secondary | ICD-10-CM | POA: Diagnosis not present

## 2020-12-26 DIAGNOSIS — J9601 Acute respiratory failure with hypoxia: Secondary | ICD-10-CM | POA: Diagnosis not present

## 2020-12-26 LAB — CULTURE, BLOOD (SINGLE)
Culture: NO GROWTH
Special Requests: ADEQUATE

## 2020-12-26 NOTE — Plan of Care (Signed)
Comfort care measures. VSS. 4L [place] for comfort. Dilaudid given for dyspnea. Q2 turns. Bed alarm on. Call bell within reach.   Problem: Education: Goal: Knowledge of risk factors and measures for prevention of condition will improve Outcome: Not Progressing   Problem: Coping: Goal: Psychosocial and spiritual needs will be supported Outcome: Not Progressing   Problem: Respiratory: Goal: Will maintain a patent airway Outcome: Not Progressing Goal: Complications related to the disease process, condition or treatment will be avoided or minimized Outcome: Not Progressing

## 2020-12-26 NOTE — TOC Initial Note (Signed)
Transition of Care [facility]) - Initial/Assessment Note    Patient Details  Name: Dean Phillips MRN: [id number] Date of Birth: 1956-09-19  Transition of Care [facility]) CM/SW Contact:    Benard Halsted, LCSW Phone Number: 12/26/2020, 8:57 AM  Clinical Narrative:                 CSW spoke with Jackelyn Poling at [facility] ([facility]). She reported that patient has been medically approved but requires financial clearance. She stated she will give CSW a call back with info on when patient can discharge there.   Expected Discharge Plan: [place] Barriers to Discharge: Hospice Bed not available   Patient Goals and CMS Choice Patient states their goals for this hospitalization and ongoing recovery are:: Comfort CMS Medicare.gov Compare Post Acute Care list provided to:: Patient Represenative (must comment)    Expected Discharge Plan and Services Expected Discharge Plan: [place] In-house Referral: Clinical Social Work,Hospice / [place] Acute Care Choice: Hospice Living arrangements for the past 2 months: [place]                                      Prior Living Arrangements/Services Living arrangements for the past 2 months: [place] Lives with:: Facility Resident Patient language and need for interpreter reviewed:: Yes Do you feel safe going back to the place where you live?: Yes      Need for Family Participation in Patient Care: Yes (Comment) Care giver support system in place?: Yes (comment)   Criminal Activity/Legal Involvement Pertinent to Current Situation/Hospitalization: No - Comment as needed  Activities of Daily Living      Permission Sought/Granted Permission sought to share information with : Facility Contact Representative,Family Supports Permission granted to share information with : No              Emotional Assessment   Attitude/Demeanor/Rapport: Unable to Assess Affect  (typically observed): Unable to Assess Orientation: :  (Unable to follow commands) Alcohol / Substance Use: Not Applicable Psych Involvement: No (comment)  Admission diagnosis:  Acute renal failure with tubular necrosis (HCC) [N17.0] AKI (acute kidney injury) ([place]) [N17.9] Acute hypoxemic respiratory failure due to COVID-19 ([place]) [U07.1, J96.01] Patient Active Problem List   Diagnosis Date Noted  . Acute hypoxemic respiratory failure due to COVID-19 ([place]) 12/21/2020  . Acute renal failure (ARF) ([place]) 12/21/2020  . Hypernatremia 12/21/2020  . Hyperkalemia 12/21/2020  . High anion gap metabolic acidosis [id number]  . Encephalopathy 12/21/2020  . HLD (hyperlipidemia) 12/21/2020   PCP:  Wenda Low, MD Pharmacy:  No Pharmacies Listed    Social Determinants of Health (SDOH) Interventions    Readmission Risk Interventions No flowsheet data found.

## 2020-12-26 NOTE — Discharge Summary (Signed)
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 PCP: none 2. Follow up LABS/TEST:  noen   Discharge Instructions    Increase activity slowly   Complete by: As directed       Diet recommendation: comfort feeds  Activity: The patient is advised to gradually reintroduce usual activities, as tolerated  Discharge Condition: stable  Code Status: DNR   History of present illness: As per the H and P dictated on admission, "Dean Phillips is a 65 y.o. male with medical history significant of stroke, hyperlipidemia, hypertension who presents after being found to be hypoxic at nursing facility.             Some history obtained with assistance of chart review due to patient's inability to participate in HPI.  Patient reportedly tested positive for Covid 2 weeks ago and had finished his isolation.  He did have 1 day of weakness and nursing staff had evaluated him in few found to be hypoxic to the 60s which improved to the 90s on 4 L.  Also reporting continued fever and cough.             Per reports from nursing staff his baseline is he is quiet but can carry on a conversation.  And he has been alert but not conversive in the ED.             Per his sister he has some degree of undiagnosed dementia but is not been formally evaluated.  He is able to have conversation, walk, feed himself.  But he has very poor memory.  ED Course: Vital signs significant for requiring 4 L to maintain  saturations.  Fever to 101.  Tachypnea in the 20s, tachycardia in 100s.  Lab work-up showed BMP with sodium 152, potassium 5.6, chloride 116, bicarb 19 with gap 17, BUN 135, creatinine 9.59, glucose 132.  LFTs showed AST 49, calcium 8.4 which corrects considering albumin of 2.7.  CBC within normal limits.  PT INR within normal limits.  PTT 40.  Lactic acid negative x2.  Respiratory panel for flu and Covid pending, but this is unknown previous positive.  Urinalysis, urine culture blood culture pending.  Ammonia normal, VBG pending.  Chest x-ray showed areas of ill-defined airspace opacities bilaterally.  Nephrology was consulted in the ED to assist with new onset AKI.  Patient started on steroids in ED."  Hospital Course:  Past medical history of CVA, HLD, HTN, CKD 3B.  Presents with complaints of hypoxia and confusion.  Patient [facility] a [facility].  Recently diagnosed with Covid on 1/14.  Did not require oxygen and was in isolation up to 1/24.  Continues to have poor p.o. intake since the initial diagnosis. Now has acute kidney injury on CKD 3B likely progressing to ESRD. Nephrology was consulted, felt the patient is not a hemodialysis candidate and recommended goals of care conversation. Prognosis is less than 2 week given his end stage renal disease and no PO intake.  Currently plan is comfort care transfer to residential hospice.  Summary of his active problems in the hospital is as following.  1.  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 Work-up showed that the patient's serum creatinine is 9.6, sodium is 151, potassium is 6 with anion gap metabolic acidosis. BUN 130. Nephrology was consulted. Patient was started on IV fluid bolus followed by D5 at 125  cc/h. This morning sodium potassium is unchanged. Mild improvement in serum creatinine but further worsening of BUN and ongoing worsening of metabolic acidosis. Ultrasound renal negative for any obstruction. No urine output so far. Nephrology was consulted and recommended that the patient is not a long-term hemodialysis candidate given his fraility and prior diagnosis of his dementia. Currently comfort care based on the discussion with the family. Hiccups resolved with scheduled Ativan.   Recommended to continue the same for now.  2.  Recent COVID-19 infection Acute hypoxic respiratory failure Likely aspiration event Diagnosed with Covid on 1/14. No fever back then.  Mild hypoxia with fever on presentation. Chest x-ray mild airspace opacity although no significant abnormality. Was on room air for at least 2 days during the hospital stay with subsequent worsening oxygen likely volume overload but aspiration event can not be ruled out.   CRP mildly elevated although likely can be poor clearance from worsening renal function. Initially started on remdesivir and steroids. We will discontinue both medication as the patient does not appear to be suffering from acute COVID-19 infection. No further need for isolation. Currently comfort care.  3.  Hyperlipidemia History of CVA On aspirin and Lipitor.  Both currently on hold.  4.  Essential hypertension Blood pressure stable. Currently antihypertensive medications are on hold.  5.  Goals of care conversation. Extensive conversation with family on phone on 1/28. Not a good candidate for hemodialysis presents with hypernatremia, hyperkalemia, metabolic acidosis, altered mental status. Recommended transition to comfort care which family agreed to. Patient will require residential hospice. Social worker consulted. Family prefers residential hospice in [place] area. Scheduled Ativan with as needed Thorazine for hiccups.   6.   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 Hepatitis panel negative. 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 Currently comfort care. Family was infromed about his diagnosis.  On the day of the discharge the patient's vitals were stable, and no other acute medical condition were reported by patient. The patient was felt safe to be discharge at residential hospice  Consultants: Nephrology  Procedures: none  DISCHARGE MEDICATION: Allergies as of 12/26/2020   No Known Allergies     Medication List    STOP taking these medications   amLODipine 10 MG tablet Commonly known as: NORVASC   aspirin EC 81 MG tablet   atorvastatin 40 MG tablet Commonly known as: LIPITOR   ergocalciferol 1.25 MG (50000 UT) capsule Commonly known as: VITAMIN D2   melatonin 3 MG Tabs tablet   mirtazapine 15 MG tablet Commonly known as: REMERON   Vitamin B12 500 MCG Tabs     TAKE these medications   acetaminophen 325 MG tablet Commonly known as: TYLENOL Take 650 mg by mouth every 6 (six) hours as needed for mild pain.       Discharge Exam: There were no vitals filed for this visit. Vitals:   12/25/20 1221 12/25/20 2031  BP: 131/74 134/80  Pulse: (!) 119 61  Resp: (!) 22 (!) 32  Temp: 98.5 F (36.9 C) 98 F (36.7 C)  SpO2: (!) 88% 94%   General: Appear in mild distress, no Rash; Oral Mucosa Clear, moist. no Abnormal Neck Mass Or lumps, Conjunctiva normal  Cardiovascular: S1 and S2 Present, no Murmur, Respiratory: increased respiratory effort, Bilateral Air entry present and bilateral Crackles, no wheezes Abdomen: Bowel Sound present Extremities: no Pedal edema Neurology: lethargic and non verbal, not oriented to time, place, and person, unresponsive.  Gait not checked due to patient safety concerns  The results of significant diagnostics from this hospitalization (including imaging, microbiology, ancillary and laboratory) are listed below for reference.    Significant Diagnostic  Studies: US RENAL  Result Date: 12/21/2020 CLINICAL DATA:  65 year old male with acute renal insufficiency. EXAM: RENAL / URINARY TRACT ULTRASOUND COMPLETE COMPARISON:  None. FINDINGS: Right Kidney: Renal measurements: 9.5 x 3.4 x 4.5 cm = volume: 78 mL. There is increased renal parenchyma echogenicity. No hydronephrosis or shadowing stone. Left Kidney: Renal measurements: 8.1 x 4.0 x 3.1 cm = volume: 52 mL. There is diffuse increased renal parenchymal echogenicity. No hydronephrosis or shadowing stone. Bladder: Appears normal for degree of bladder distention. Other: None. IMPRESSION: Echogenic kidneys likely related to medical renal disease. No hydronephrosis or shadowing stone. Electronically Signed   By: Anner Crete M.D.   On: 12/21/2020 20:25   DG Chest Port 1 View  Result Date: 12/21/2020 CLINICAL DATA:  Sepsis EXAM: PORTABLE CHEST 1 VIEW COMPARISON:  None. FINDINGS: There is ill-defined airspace opacity in the right mid lung and both lung base regions. There is atelectatic change in the right base. No consolidation. Heart size and pulmonary vascularity are within normal limits. No adenopathy. There is aortic atherosclerosis. No bone lesions. IMPRESSION: Areas of ill-defined airspace opacity bilaterally concerning for atypical organism pneumonia. Check of COVID-19 status advised. Right base atelectasis. No consolidation or edema. Heart size normal.  No adenopathy appreciable. Aortic Atherosclerosis (ICD10-I70.0). Electronically Signed   By: Lowella Grip III M.D.   On: 12/21/2020 14:49    Microbiology: Recent Results (from the past 240 hour(s))  Blood culture (routine single)     Status: None (Preliminary result)   Collection Time: 12/21/20  1:35 PM   Specimen: BLOOD  Result Value Ref Range Status   Specimen Description BLOOD RIGHT ANTECUBITAL  Final   Special Requests   Final    BOTTLES DRAWN AEROBIC AND ANAEROBIC Blood Culture adequate volume   Culture   Final    NO GROWTH 4  DAYS Performed at [facility], [address] [place], [place] [address]    Report Status PENDING  Incomplete  SARS Coronavirus 2 by RT PCR (hospital order, performed in [facility] hospital lab) Nasopharyngeal Nasopharyngeal Swab     Status: Abnormal   Collection Time: 12/21/20  6:46 PM   Specimen: Nasopharyngeal Swab  Result Value Ref Range Status   SARS Coronavirus 2 POSITIVE (A) NEGATIVE Final    Comment: RESULT CALLED TO, READ BACK BY AND VERIFIED WITH: E SULLIVAN RN 2107 12/21/20 A BROWNING (NOTE) SARS-CoV-2 target nucleic acids are DETECTED  SARS-CoV-2 RNA is generally detectable in upper respiratory specimens  during the acute phase of infection.  Positive results are indicative  of the presence of the identified virus, but do not rule out bacterial infection or co-infection with other pathogens not detected by the test.  Clinical correlation with patient history and  other diagnostic information is necessary to  determine patient infection status.  The expected result is negative.  Fact Sheet for Patients:   StrictlyIdeas.no   Fact Sheet for Healthcare Providers:   BankingDealers.co.za    This test is not yet approved or cleared by the [place] FDA and  has been authorized for detection and/or diagnosis of SARS-CoV-2 by FDA under an Emergency Use Authorization (EUA).  This EUA will remain in effect (meaning this  test can be used) for the duration of  the COVID-19 declaration under Section 564(b)(1) of the Act, 21 U.S.C. section 360-bbb-3(b)(1), unless the authorization is terminated or revoked sooner.  Performed at [facility], [place] [address], [place], [place] [address]   MRSA PCR Screening     Status: None   Collection Time: 12/22/20  9:51 AM   Specimen: Nasal Mucosa; Nasopharyngeal  Result Value Ref Range Status   MRSA by PCR NEGATIVE NEGATIVE Final    Comment:        The GeneXpert MRSA Assay  (FDA approved for NASAL specimens only), is one component of a comprehensive MRSA colonization surveillance program. It is not intended to diagnose MRSA infection nor to guide or monitor treatment for MRSA infections. Performed at [facility], [place] [address], [place], [place] [address]      Labs: CBC: Recent Labs  Lab 12/21/20 1335 12/22/20 0445  WBC 10.2 10.4  NEUTROABS 8.6* 8.6*  HGB 14.7 12.8*  HCT 45.6 41.9  MCV 93.1 94.8  PLT 179 [id number]*   Basic Metabolic Panel: Recent Labs  Lab 12/21/20 1616 12/22/20 0250 12/22/20 0445 12/22/20 0912 12/22/20 1114  NA 152* 151* 153*  --  150*  K 5.6* 6.1* 6.4*  --  6.1*  CL 116* 120* 122*  --  120*  CO2 19* 15* 17*  --  16*  GLUCOSE 132* 140* 149* 165* 158*  BUN 133* 135* 136*  --  141*  CREATININE 9.59* 9.62* 9.60*  --  9.34*  CALCIUM 8.4* 8.0* 8.1*  --  8.0*  MG  --   --  2.3  --   --   PHOS  --   --  6.3*  --  5.2*   Liver Function Tests: Recent Labs  Lab 12/21/20 1616 12/22/20 0445 12/22/20 1114  AST 44* 32  --   ALT 31 27  --   ALKPHOS 66 59  --   BILITOT 0.9 0.5  --   PROT 7.9 7.2  --   ALBUMIN 2.7* 2.3* 2.3*   CBG: Recent Labs  Lab 12/21/20 1311  GLUCAP 109*    Time spent: 35 minutes  Signed:  Berle Mull  [facility]  12/26/2020 10:30 AM

## 2020-12-26 NOTE — TOC Transition Note (Signed)
Transition of Care [facility]) - CM/SW Discharge Note   Patient Details  Name: Dean Phillips MRN: [id number] Date of Birth: 1955/12/22  Transition of Care [facility]) CM/SW Contact:  Benard Halsted, LCSW Phone Number: 12/26/2020, 1:20 PM   Clinical Narrative:    Patient will DC to: [place] Anticipated DC date: 12/26/20 Family notified: Sister Transport by: Corey Harold   Per MD patient ready for DC to Hospice. RN to call report prior to discharge [phone]). RN, patient, patient's family, and facility notified of DC. Discharge Summary and FL2 sent to facility. DC packet on chart. Ambulance transport requested for patient.   CSW will sign off for now as social work intervention is no longer needed. Please consult [place] again if new needs arise.      Final next level of care: [place] Barriers to Discharge: Hospice Bed not available   Patient Goals and CMS Choice Patient states their goals for this hospitalization and ongoing recovery are:: Comfort CMS Medicare.gov Compare Post Acute Care list provided to:: Patient Represenative (must comment)    Discharge Placement                Patient to be transferred to facility by: [place] Name of family member notified: Sister Patient and family notified of of transfer: 12/26/20  Discharge Plan and Services In-house Referral: Clinical Social Work,Hospice / Palliative Care   Post Acute Care Choice: Hospice                               Social Determinants of Health (SDOH) Interventions     Readmission Risk Interventions No flowsheet data found.

## 2020-12-26 NOTE — Progress Notes (Signed)
Report called to [facility] at [facility]. All questions answered. PIV left intact per receiving nurse's request. PTAR here to pick up patient.

## 2021-01-23 DEATH — deceased

## 2021-05-24 IMAGING — US US RENAL
1 series · 14 of 25 positions shown · non-contrast
Comparison: None.

CLINICAL DATA: 64-year-old male with acute renal insufficiency.

EXAM:
RENAL / URINARY TRACT ULTRASOUND COMPLETE

[Series 1: us renal · 14 of 31 slices shown]
[im 1/31]
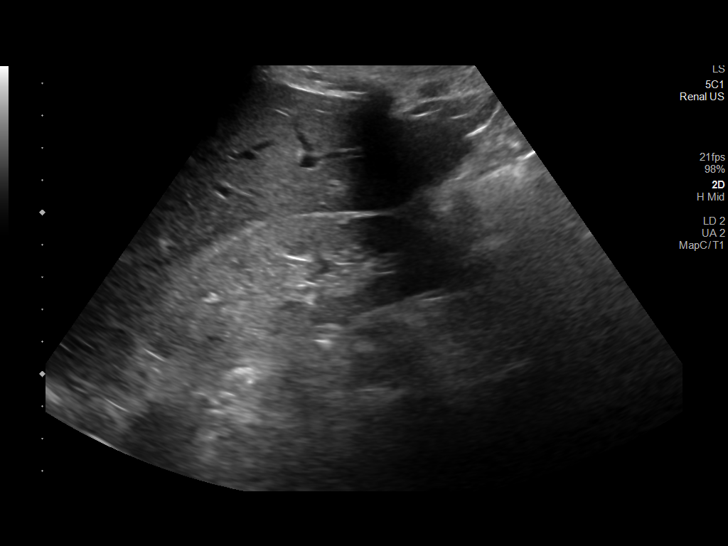
[im 3/31]
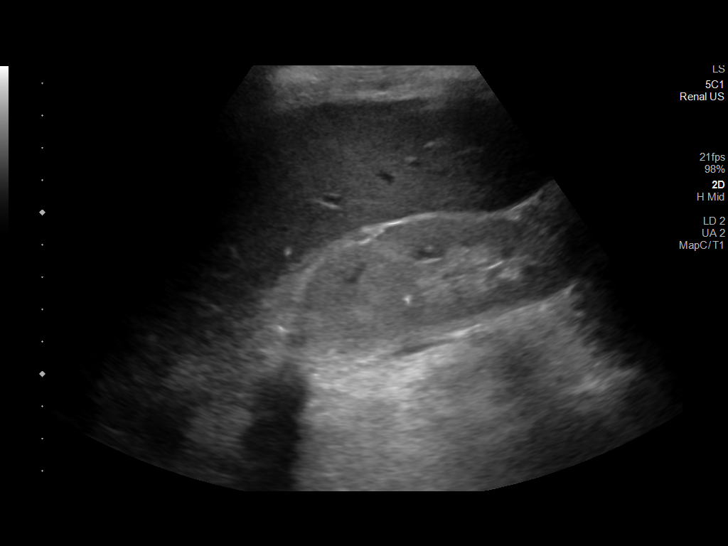
[im 6/31]
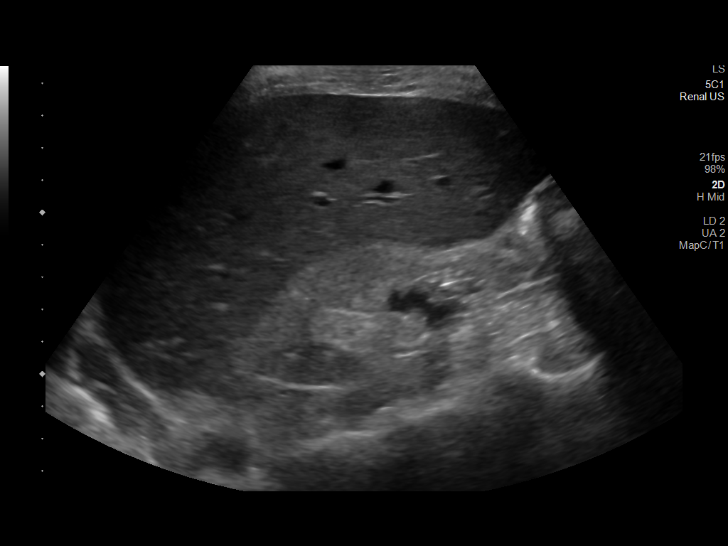
[im 8/31]
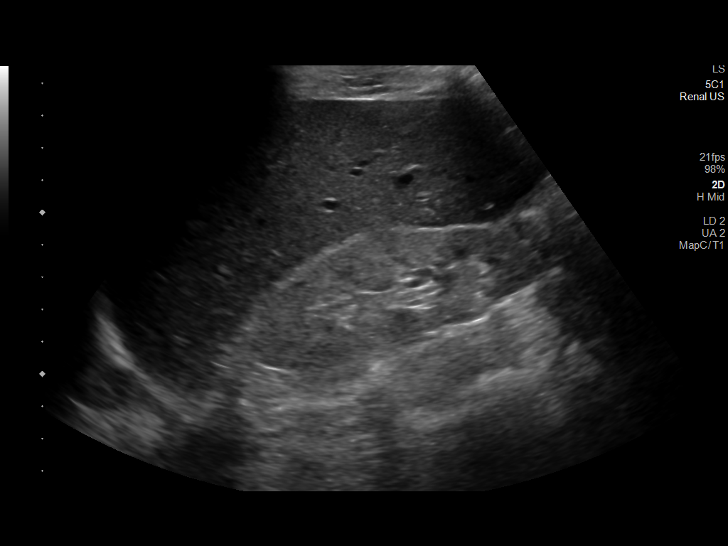
[im 11/31]
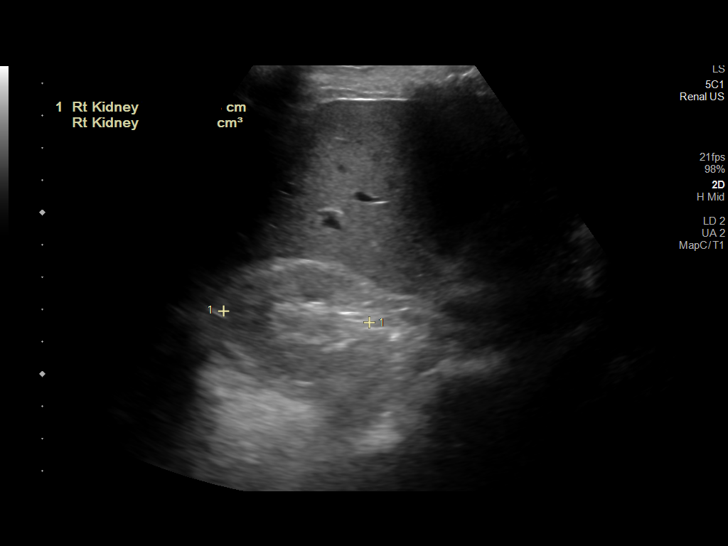
[im 12/31]
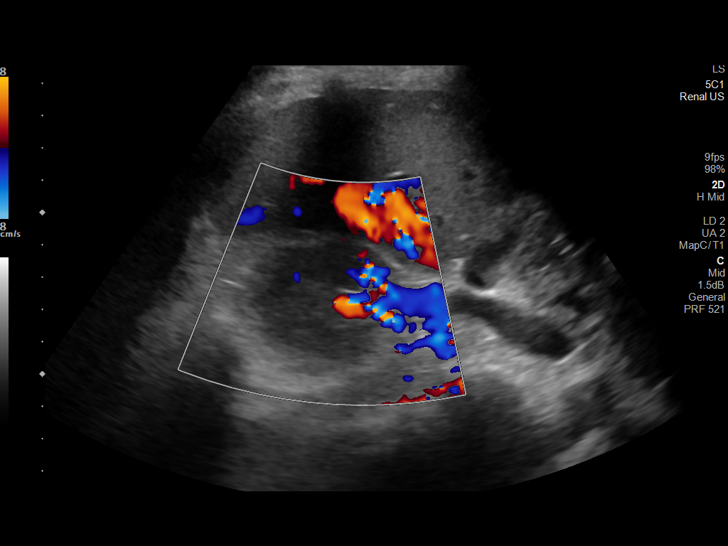
[im 14/31]
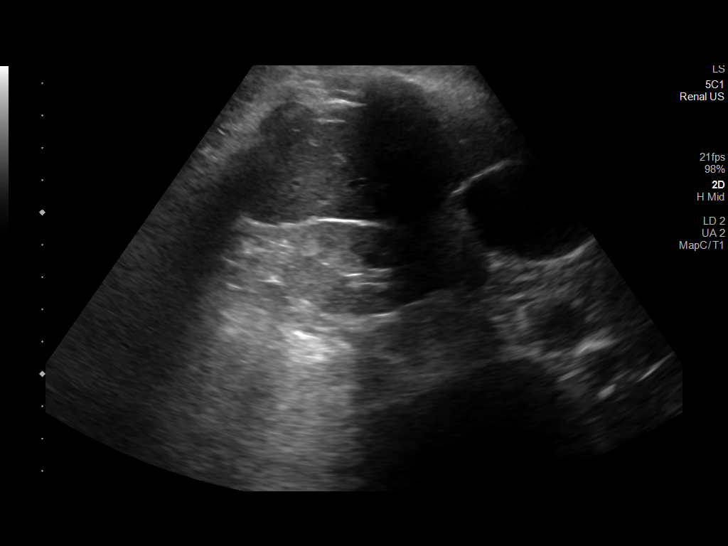
[im 17/31]
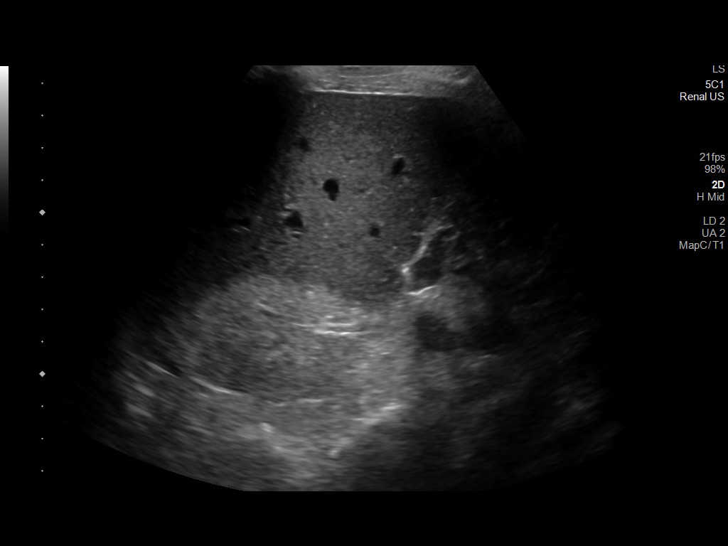
[im 19/31]
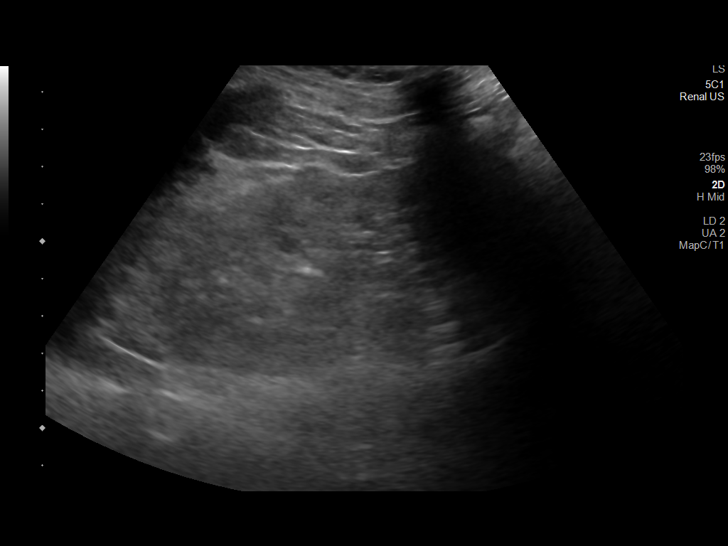
[im 21/31]
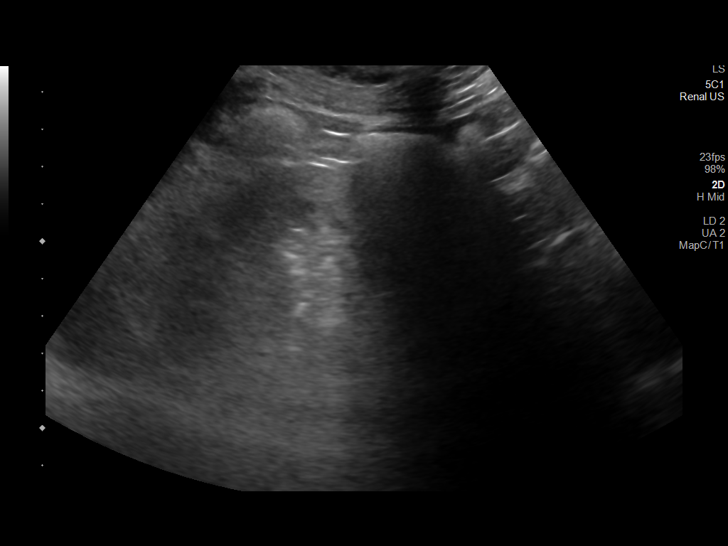
[im 23/31]
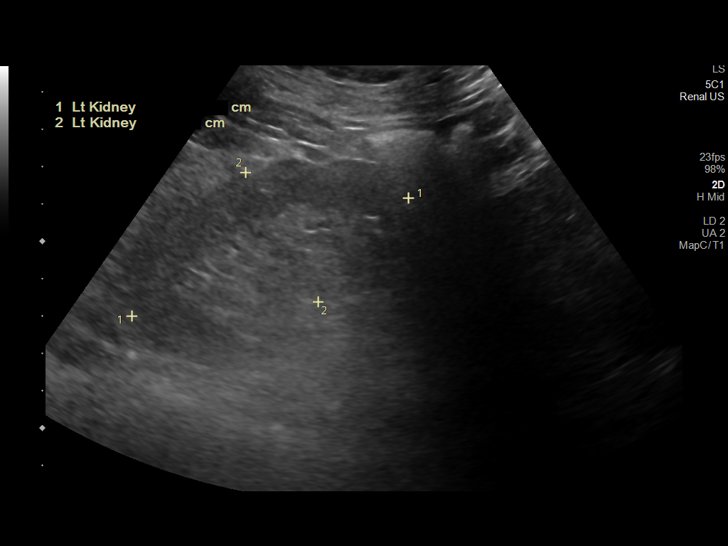
[im 26/31]
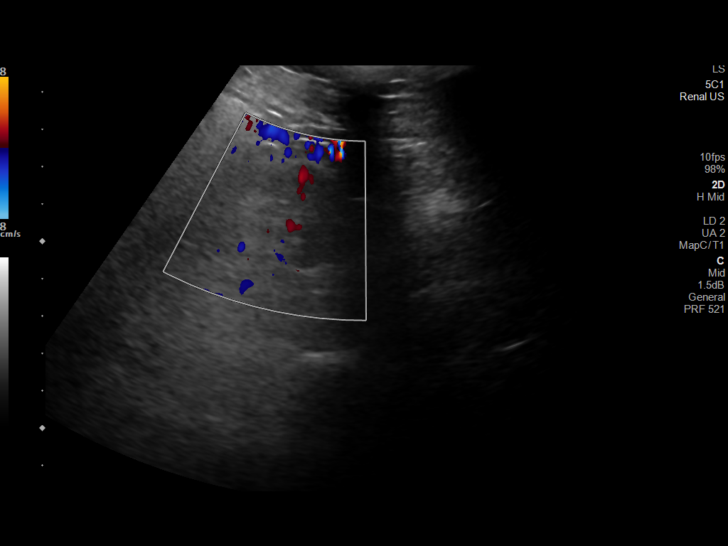
[im 28/31]
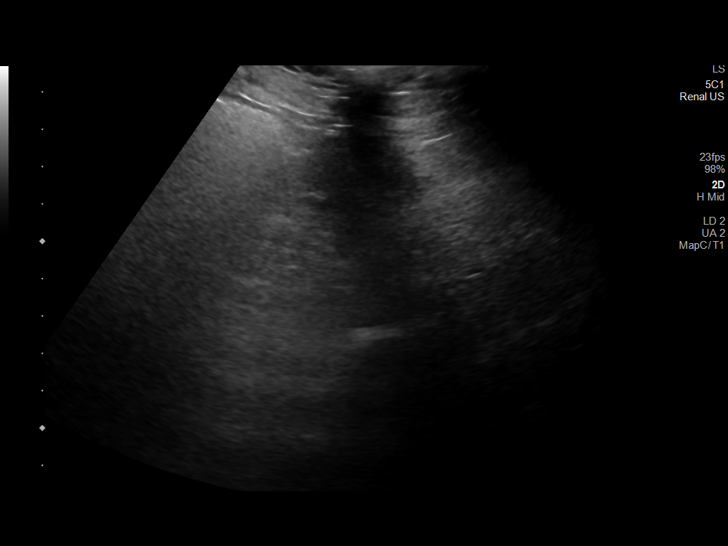
[im 31/31]
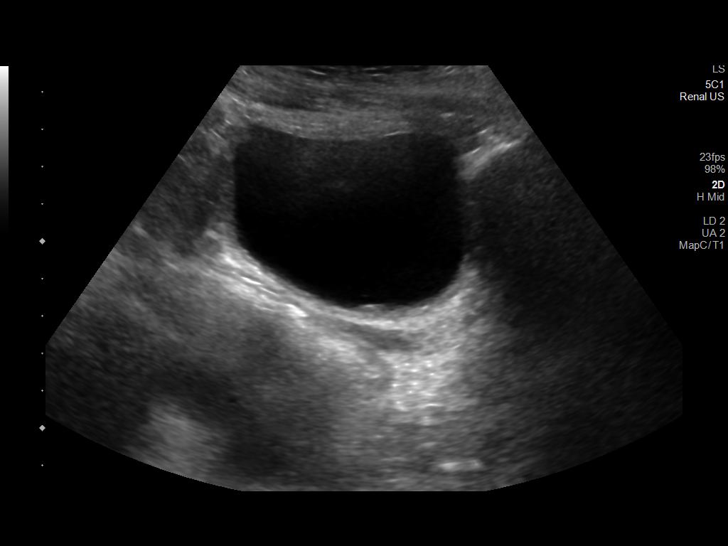

[14 of 25 positions shown; findings below may reference images not displayed]

FINDINGS: Right Kidney:

Renal measurements: 9.5 x 3.4 x 4.5 cm = volume: 78 mL. There is
increased renal parenchyma echogenicity. No hydronephrosis or
shadowing stone.

Left Kidney:

Renal measurements: 8.1 x 4.0 x 3.1 cm = volume: 52 mL. There is
diffuse increased renal parenchymal echogenicity. No hydronephrosis
or shadowing stone.

Bladder:

Appears normal for degree of bladder distention.

Other:

None.
IMPRESSION: Echogenic kidneys likely related to medical renal disease. No
hydronephrosis or shadowing stone.
